# Patient Record
Sex: Female | Born: 1980 | Race: White | Hispanic: No | Marital: Married | Smoking: Former smoker
Health system: Southern US, Community
[De-identification: ages and names within clinical notes are randomized; demographics above are authoritative.]

## PROBLEM LIST (undated history)

## (undated) ENCOUNTER — Inpatient Hospital Stay (HOSPITAL_COMMUNITY): Payer: Self-pay

## (undated) DIAGNOSIS — M1990 Unspecified osteoarthritis, unspecified site: Secondary | ICD-10-CM

## (undated) DIAGNOSIS — IMO0002 Reserved for concepts with insufficient information to code with codable children: Secondary | ICD-10-CM

## (undated) DIAGNOSIS — O99119 Other diseases of the blood and blood-forming organs and certain disorders involving the immune mechanism complicating pregnancy, unspecified trimester: Secondary | ICD-10-CM

## (undated) DIAGNOSIS — R87619 Unspecified abnormal cytological findings in specimens from cervix uteri: Secondary | ICD-10-CM

## (undated) DIAGNOSIS — R51 Headache: Secondary | ICD-10-CM

## (undated) DIAGNOSIS — D6862 Lupus anticoagulant syndrome: Secondary | ICD-10-CM

## (undated) DIAGNOSIS — O99350 Diseases of the nervous system complicating pregnancy, unspecified trimester: Secondary | ICD-10-CM

## (undated) DIAGNOSIS — B009 Herpesviral infection, unspecified: Secondary | ICD-10-CM

## (undated) DIAGNOSIS — D696 Thrombocytopenia, unspecified: Secondary | ICD-10-CM

## (undated) DIAGNOSIS — T7840XA Allergy, unspecified, initial encounter: Secondary | ICD-10-CM

## (undated) DIAGNOSIS — R519 Headache, unspecified: Secondary | ICD-10-CM

## (undated) DIAGNOSIS — F419 Anxiety disorder, unspecified: Secondary | ICD-10-CM

## (undated) DIAGNOSIS — G5600 Carpal tunnel syndrome, unspecified upper limb: Secondary | ICD-10-CM

## (undated) DIAGNOSIS — L4050 Arthropathic psoriasis, unspecified: Secondary | ICD-10-CM

## (undated) DIAGNOSIS — K5190 Ulcerative colitis, unspecified, without complications: Secondary | ICD-10-CM

## (undated) HISTORY — DX: Lupus anticoagulant syndrome: D68.62

## (undated) HISTORY — DX: Carpal tunnel syndrome, unspecified upper limb: G56.00

## (undated) HISTORY — DX: Arthropathic psoriasis, unspecified: L40.50

## (undated) HISTORY — DX: Herpesviral infection, unspecified: B00.9

## (undated) HISTORY — PX: MOUTH SURGERY: SHX715

## (undated) HISTORY — PX: WISDOM TOOTH EXTRACTION: SHX21

## (undated) HISTORY — DX: Anxiety disorder, unspecified: F41.9

## (undated) HISTORY — DX: Diseases of the nervous system complicating pregnancy, unspecified trimester: O99.350

## (undated) HISTORY — DX: Allergy, unspecified, initial encounter: T78.40XA

## (undated) HISTORY — PX: LEEP: SHX91

## (undated) HISTORY — DX: Ulcerative colitis, unspecified, without complications: K51.90

---

## 2007-03-14 ENCOUNTER — Other Ambulatory Visit: Admission: RE | Admit: 2007-03-14 | Discharge: 2007-03-14 | Payer: Self-pay | Admitting: Obstetrics and Gynecology

## 2008-03-16 ENCOUNTER — Other Ambulatory Visit: Admission: RE | Admit: 2008-03-16 | Discharge: 2008-03-16 | Payer: Self-pay | Admitting: Obstetrics & Gynecology

## 2010-06-09 ENCOUNTER — Ambulatory Visit (INDEPENDENT_AMBULATORY_CARE_PROVIDER_SITE_OTHER): Payer: Self-pay | Admitting: Cardiovascular Disease

## 2010-06-09 DIAGNOSIS — R079 Chest pain, unspecified: Secondary | ICD-10-CM

## 2011-04-11 LAB — OB RESULTS CONSOLE ABO/RH

## 2011-04-11 LAB — OB RESULTS CONSOLE RUBELLA ANTIBODY, IGM: Rubella: IMMUNE

## 2011-04-11 LAB — OB RESULTS CONSOLE ANTIBODY SCREEN: Antibody Screen: NEGATIVE

## 2011-04-11 LAB — OB RESULTS CONSOLE HEPATITIS B SURFACE ANTIGEN: Hepatitis B Surface Ag: NEGATIVE

## 2011-04-11 LAB — OB RESULTS CONSOLE GC/CHLAMYDIA: Gonorrhea: NEGATIVE

## 2011-04-25 DIAGNOSIS — G5600 Carpal tunnel syndrome, unspecified upper limb: Secondary | ICD-10-CM

## 2011-04-25 HISTORY — DX: Carpal tunnel syndrome, unspecified upper limb: G56.00

## 2011-07-26 ENCOUNTER — Ambulatory Visit: Payer: Self-pay | Admitting: Cardiovascular Disease

## 2011-09-05 ENCOUNTER — Encounter: Payer: Self-pay | Admitting: *Deleted

## 2011-09-08 ENCOUNTER — Ambulatory Visit: Payer: Self-pay | Admitting: Cardiovascular Disease

## 2011-10-27 ENCOUNTER — Inpatient Hospital Stay (HOSPITAL_COMMUNITY)
Admission: AD | Admit: 2011-10-27 | Discharge: 2011-10-27 | Disposition: A | Discharge status: Home / Self Care | Payer: BC Managed Care – PPO | Source: Ambulatory Visit | Attending: Obstetrics and Gynecology | Admitting: Obstetrics and Gynecology

## 2011-10-27 ENCOUNTER — Encounter (HOSPITAL_COMMUNITY): Payer: Self-pay

## 2011-10-27 DIAGNOSIS — O479 False labor, unspecified: Secondary | ICD-10-CM

## 2011-10-27 DIAGNOSIS — O4690 Antepartum hemorrhage, unspecified, unspecified trimester: Secondary | ICD-10-CM | POA: Insufficient documentation

## 2011-10-27 HISTORY — DX: Unspecified abnormal cytological findings in specimens from cervix uteri: R87.619

## 2011-10-27 HISTORY — DX: Reserved for concepts with insufficient information to code with codable children: IMO0002

## 2011-10-27 LAB — POCT FERN TEST

## 2011-10-27 NOTE — MAU Note (Signed)
Ariel Pierce CNM in to do speculum exam- no fluid seen- fern negative. Vag exam 1/70/-3 vertex

## 2011-10-27 NOTE — MAU Provider Note (Signed)
  History     CSN: [id number]  Arrival date and time: 10/27/11 1540   First Provider Initiated Contact with Patient 10/27/11 1711      Chief Complaint  Patient presents with  . Vaginal Bleeding  . Rupture of Membranes   HPI This is a .31 y.o. female at [redacted]w[redacted]d who presents with c/o  vaginal bleeding and possible leaking after having a cervical exam this morning. Blood she saw was brown. + FM.   OB History    Grav Para Term Preterm Abortions TAB SAB Ect Mult Living   1               Past Medical History  Diagnosis Date  . Chest pain   . Lupus anticoagulant disorder   . Abnormal Pap smear     Past Surgical History  Procedure Date  . No past surgeries     No family history on file.  History  Substance Use Topics  . Smoking status: Never Smoker   . Smokeless tobacco: Not on file  . Alcohol Use: No     Rarely    Allergies: No Known Allergies  Prescriptions prior to admission  Medication Sig Dispense Refill  . acetaminophen-codeine (TYLENOL #3) 300-30 MG per tablet Take 1 tablet by mouth every 4 (four) hours as needed. pain      . fexofenadine (ALLEGRA) 180 MG tablet Take 180 mg by mouth daily.      Marland Kitchen loratadine-pseudoephedrine (CLARITIN-D 12-HOUR) 5-120 MG per tablet Take 1 tablet by mouth daily as needed. allergies      . Prenatal Vit-Fe Fumarate-FA (PRENATAL MULTIVITAMIN) TABS Take 1 tablet by mouth at bedtime.        ROS As in HPI  Physical Exam   Blood pressure 125/75, pulse 94, temperature 97.9 F (36.6 C), temperature source Oral, resp. rate 20, height 5\' 5"  (1.651 m), weight 190 lb 9.6 oz (86.456 kg), SpO2 100.00%.  Physical Exam  Constitutional: She is oriented to person, place, and time. She appears well-developed and well-nourished. No distress.  Cardiovascular: Normal rate.   Respiratory: Effort normal.  GI: Soft.  Genitourinary: Uterus normal. Vaginal discharge (dk brown show, ) found.       Dilation: 1 Effacement (%): 70 Cervical Position:  Posterior Station: -3 Presentation: Vertex Exam by:: Artelia Laroche CNM   Musculoskeletal: Normal range of motion.  Neurological: She is alert and oriented to person, place, and time.  Skin: Skin is warm and dry.  Psychiatric: She has a normal mood and affect.   Spec Exam:  No pooling, No ferning  MAU Course  Procedures  Assessment and Plan  A:  SIUP at [redacted]w[redacted]d       Intact membranes      Bloody mucous s/p cervical exam P:  Discharge home       Labor precautions       Followup at office as needed/scheduled  [facility] 10/27/2011, 5:53 PM

## 2011-10-27 NOTE — MAU Note (Signed)
Patient states she was seen in the office today and was 2 cm. Had a period like episode of brown bleeding with fluid. Has been having some cramping. Reports good fetal movement.

## 2011-11-14 ENCOUNTER — Telehealth (HOSPITAL_COMMUNITY): Payer: Self-pay | Admitting: *Deleted

## 2011-11-14 ENCOUNTER — Encounter (HOSPITAL_COMMUNITY): Payer: Self-pay | Admitting: *Deleted

## 2011-11-14 NOTE — Telephone Encounter (Signed)
Preadmission screen  

## 2011-11-15 ENCOUNTER — Inpatient Hospital Stay (HOSPITAL_COMMUNITY)
Admission: AD | Admit: 2011-11-15 | Discharge: 2011-11-18 | DRG: 371 | Disposition: A | Discharge status: Home / Self Care | Payer: BC Managed Care – PPO | Source: Ambulatory Visit | Attending: Obstetrics and Gynecology | Admitting: Obstetrics and Gynecology

## 2011-11-15 ENCOUNTER — Encounter (HOSPITAL_COMMUNITY): Payer: Self-pay | Admitting: *Deleted

## 2011-11-15 DIAGNOSIS — Z22330 Carrier of Group B streptococcus: Secondary | ICD-10-CM

## 2011-11-15 DIAGNOSIS — O99892 Other specified diseases and conditions complicating childbirth: Secondary | ICD-10-CM | POA: Diagnosis present

## 2011-11-15 DIAGNOSIS — O411090 Infection of amniotic sac and membranes, unspecified, unspecified trimester, not applicable or unspecified: Secondary | ICD-10-CM | POA: Diagnosis present

## 2011-11-15 LAB — COMPREHENSIVE METABOLIC PANEL
ALT: 13 U/L (ref 0–35)
AST: 23 U/L (ref 0–37)
Albumin: 2.6 g/dL — ABNORMAL LOW (ref 3.5–5.2)
Calcium: 8.5 mg/dL (ref 8.4–10.5)
Creatinine, Ser: 0.68 mg/dL (ref 0.50–1.10)
Sodium: 134 mEq/L — ABNORMAL LOW (ref 135–145)
Total Protein: 5.6 g/dL — ABNORMAL LOW (ref 6.0–8.3)

## 2011-11-15 LAB — URINALYSIS, ROUTINE W REFLEX MICROSCOPIC
Bilirubin Urine: NEGATIVE
Ketones, ur: 15 mg/dL — AB
Leukocytes, UA: NEGATIVE
Nitrite: NEGATIVE
Urobilinogen, UA: 0.2 mg/dL (ref 0.0–1.0)

## 2011-11-15 LAB — TYPE AND SCREEN
ABO/RH(D): O POS
Antibody Screen: NEGATIVE

## 2011-11-15 LAB — CBC
Hemoglobin: 13.2 g/dL (ref 12.0–15.0)
MCHC: 34.9 g/dL (ref 30.0–36.0)
Platelets: 149 10*3/uL — ABNORMAL LOW (ref 150–400)
RDW: 13.6 % (ref 11.5–15.5)

## 2011-11-15 MED ORDER — OXYCODONE-ACETAMINOPHEN 5-325 MG PO TABS: 1.0000 | ORAL_TABLET | ORAL | Status: DC | PRN

## 2011-11-15 MED ORDER — LACTATED RINGERS IV SOLN: 500.0000 mL | INTRAVENOUS | Status: DC | PRN

## 2011-11-15 MED ORDER — LIDOCAINE HCL (PF) 1 % IJ SOLN: 30.0000 mL | INTRAMUSCULAR | Status: DC | PRN

## 2011-11-15 MED ORDER — IBUPROFEN 600 MG PO TABS: 600.0000 mg | ORAL_TABLET | Freq: Four times a day (QID) | ORAL | Status: DC | PRN

## 2011-11-15 MED ORDER — POTASSIUM CHLORIDE CRYS ER 20 MEQ PO TBCR
20.0000 meq | EXTENDED_RELEASE_TABLET | Freq: Two times a day (BID) | ORAL | Status: DC
  Administered 2011-11-15 – 2011-11-16 (×2): 20 meq via ORAL
  Filled 2011-11-15 (×5): qty 1

## 2011-11-15 MED ORDER — GENTAMICIN SULFATE 40 MG/ML IJ SOLN
180.0000 mg | Freq: Three times a day (TID) | INTRAVENOUS | Status: DC
  Administered 2011-11-15 – 2011-11-16 (×3): 180 mg via INTRAVENOUS
  Filled 2011-11-15 (×4): qty 4.5

## 2011-11-15 MED ORDER — SODIUM CHLORIDE 0.9 % IV SOLN
1.0000 g | INTRAVENOUS | Status: DC
  Administered 2011-11-16 (×3): 1 g via INTRAVENOUS
  Filled 2011-11-15 (×7): qty 1000

## 2011-11-15 MED ORDER — ONDANSETRON HCL 4 MG/2ML IJ SOLN: 4.0000 mg | Freq: Four times a day (QID) | INTRAMUSCULAR | Status: DC | PRN

## 2011-11-15 MED ORDER — LACTATED RINGERS IV SOLN
INTRAVENOUS | Status: DC
  Administered 2011-11-15 – 2011-11-16 (×4): via INTRAVENOUS

## 2011-11-15 MED ORDER — AMPICILLIN SODIUM 2 G IJ SOLR
2.0000 g | Freq: Once | INTRAMUSCULAR | Status: AC
  Administered 2011-11-15: 2 g via INTRAVENOUS
  Filled 2011-11-15: qty 2000

## 2011-11-15 MED ORDER — POTASSIUM CHLORIDE 20 MEQ PO PACK: 20.0000 meq | PACK | Freq: Two times a day (BID) | ORAL | Status: DC

## 2011-11-15 MED ORDER — PENICILLIN G POTASSIUM 5000000 UNITS IJ SOLR: 2.5000 10*6.[IU] | INTRAVENOUS | Status: DC

## 2011-11-15 MED ORDER — CITRIC ACID-SODIUM CITRATE 334-500 MG/5ML PO SOLN
30.0000 mL | ORAL | Status: DC | PRN
  Administered 2011-11-16: 30 mL via ORAL
  Filled 2011-11-15: qty 15

## 2011-11-15 MED ORDER — FLEET ENEMA 7-19 GM/118ML RE ENEM: 1.0000 | ENEMA | RECTAL | Status: DC | PRN

## 2011-11-15 MED ORDER — OXYTOCIN BOLUS FROM INFUSION
250.0000 mL | Freq: Once | INTRAVENOUS | Status: DC
  Filled 2011-11-15: qty 500

## 2011-11-15 MED ORDER — PENICILLIN G POTASSIUM 5000000 UNITS IJ SOLR: 5.0000 10*6.[IU] | Freq: Once | INTRAVENOUS | Status: DC

## 2011-11-15 MED ORDER — ACETAMINOPHEN 325 MG PO TABS: 650.0000 mg | ORAL_TABLET | ORAL | Status: DC | PRN

## 2011-11-15 MED ORDER — ACETAMINOPHEN 500 MG PO TABS
1000.0000 mg | ORAL_TABLET | Freq: Four times a day (QID) | ORAL | Status: DC | PRN
  Administered 2011-11-15 – 2011-11-16 (×3): 1000 mg via ORAL
  Filled 2011-11-15 (×3): qty 2

## 2011-11-15 MED ORDER — OXYTOCIN 40 UNITS IN LACTATED RINGERS INFUSION - SIMPLE MED: 62.5000 mL/h | Freq: Once | INTRAVENOUS | Status: DC

## 2011-11-15 NOTE — MAU Provider Note (Signed)
Ariel Pierce is a 31 y.o. female @ [redacted]w[redacted]d gestation who presents to MAU for fever, visual changes, generalized weakness and flu like symptoms. She began yesterday with abdominal pain and stiffness in her arms and legs. She called the office and they told her it was probably nerve pain in her lower abdomen and legs.  The symptoms lasted about 3 hours. Today the symptoms returned with fever. She reports a cramping pain in her lower abdomen that she rates 8/10 that is constant with occasional sharp pain. She also reports seeing " shooting stars before her eyes".  Her legs feel tight and hurt.  Exam: BP 152/86  Pulse 128  Temp 100.4 F (38 C) (Oral)  Resp 20  Ht 5\' 6"  (1.676 m)  Wt 191 lb (86.637 kg)  BMI 30.83 kg/m2  SpO2 100%  Patient is alert and oriented and in no acute distress. Abdomen gravid consistent with dates. Non tender with palpation. Unable to reproduce the cramping pain in the lower abdomen.  Lower extremities with edema.  Cervical check by RN Alvino Chapel) and is unchanged from the last office visit.  EFM Baseline 175, contracting every 3 minutes, reactive tracing.   Results for orders placed during the hospital encounter of 11/15/11 (from the past 24 hour(s))  URINALYSIS, ROUTINE W REFLEX MICROSCOPIC     Status: Abnormal   Collection Time   11/15/11  8:35 PM      Component Value Range   Color, Urine YELLOW  YELLOW   APPearance CLEAR  CLEAR   Specific Gravity, Urine 1.010  1.005 - 1.030   pH 7.5  5.0 - 8.0   Glucose, UA NEGATIVE  NEGATIVE mg/dL   Hgb urine dipstick NEGATIVE  NEGATIVE   Bilirubin Urine NEGATIVE  NEGATIVE   Ketones, ur 15 (*) NEGATIVE mg/dL   Protein, ur NEGATIVE  NEGATIVE mg/dL   Urobilinogen, UA 0.2  0.0 - 1.0 mg/dL   Nitrite NEGATIVE  NEGATIVE   Leukocytes, UA NEGATIVE  NEGATIVE   Consult with Dr. Renaldo Fiddler. She will put in admission orders.   Medical screening exam complete and patient is stable to continue care by Dr. Renaldo Fiddler.

## 2011-11-15 NOTE — H&P (Signed)
31 yo G1 @ 39+5 presents w/ 1 day h/o fever & bilateral lower abdominal pain.  Pt reports BLQ pain began yesterday a/w back pain.  Fever and generalized ill-feeling began today.  No dysuria, hematuria.  Mild nausea, no emesis.  No vb or lof.  PNC:  GBS +  PMHx:  + h/o lupus anticoagulant.  Negative w/u for APLS, no h/o VTE PSHx:  Neg All:  Zyrtec, latex Meds:  PNV, oxycodone prn back pain  Temp 100.4, BP 130-150s/80-90s,  HR 120s   FHT 170-180, reassuring w/ accels Toco Q3-5 Gen - NAD CV - tachy, regular rhythm Lungs - clear bilaterally Abd - gravid w/ moderate bilateral LQ tenderness.  No R/G.  No CVAT Ext - 2-3+ edema bilaterally Cvx - 2cm,   A/P:  Suspect Chorioamnionitis Start ampicillin & gentamycin Tylenol Plan to augment w/ pitocin

## 2011-11-15 NOTE — Progress Notes (Signed)
Dr Renaldo Fiddler notified of Rm #[id number] and that pt has constant, lower abd pain that is not due to ctxs. When NP palpated lower abd, could not reproduce the pain.

## 2011-11-15 NOTE — Progress Notes (Signed)
ERin RN in [facility] given report.

## 2011-11-15 NOTE — MAU Note (Signed)
Pt G1 at 39.5wks having abd pain since Sunday was seen in the office yesterday, dx with nerve pain-given oxycodone which is not helping.  Pain continues and is becoming stronger.  Pt reports extremity weakness and stiffness since 1400 today and a fever of 99.1 about ago.

## 2011-11-15 NOTE — Progress Notes (Signed)
To Bs via w/c 

## 2011-11-15 NOTE — Progress Notes (Signed)
ANTIBIOTIC CONSULT NOTE - INITIAL  Pharmacy Consult for Gentamicin Indication: R/O Chorioamnionitis  Allergies  Allergen Reactions  . Zyrtec (Cetirizine Hcl) Swelling  . Latex Rash    Patient Measurements: Height: 5\' 6"  (167.6 cm) Weight: 191 lb (86.637 kg) IBW/kg (Calculated) : 59.3  Adjusted Body Weight: 67.5kg  Vital Signs: Temp: 100.4 F (38 C) (07/24 2045) Temp src: Oral (07/24 2045) BP: 152/87 mmHg (07/24 2116) Pulse Rate: 133  (07/24 2116)  Labs:  Basename 11/15/11 2135  WBC 23.1*  HGB 13.2  PLT 149*  LABCREA --  CREATININE --   Estimated SCr=0.7 with estimated CrCl > 140ml/min  Medical History: Past Medical History  Diagnosis Date  . Chest pain   . Lupus anticoagulant disorder   . Abnormal Pap smear   . Pregnancy related carpal tunnel syndrome, antepartum   . H/O varicella   . Anxiety     panic attacks    Medications:  Ampicillin 2 gram IV then 1 gram IV q6h Assessment: [name] 39+ weeks admitted for IOL. Antibiotics initiated for Maternal temp(100.4) and bilateral abd pain x 1 day.  Goal of Therapy:  Gentamicin peak 6-32mcg/ml and trough < 69mcg/ml  Plan:  1. Gentamicin 180mg  IV q8h. 2. Will draw SCr if continued postpartum. 3. Will continue to follow and draw levels as clinically indicated. Thanks!  Claybon Jabs 11/15/2011,9:55 PM

## 2011-11-16 ENCOUNTER — Encounter (HOSPITAL_COMMUNITY): Payer: Self-pay | Admitting: Anesthesiology

## 2011-11-16 ENCOUNTER — Encounter (HOSPITAL_COMMUNITY)
Admission: AD | Disposition: A | Discharge status: Home / Self Care | Payer: Self-pay | Source: Ambulatory Visit | Attending: Obstetrics and Gynecology

## 2011-11-16 ENCOUNTER — Encounter (HOSPITAL_COMMUNITY): Payer: Self-pay | Admitting: *Deleted

## 2011-11-16 ENCOUNTER — Inpatient Hospital Stay (HOSPITAL_COMMUNITY): Payer: BC Managed Care – PPO | Admitting: Anesthesiology

## 2011-11-16 LAB — CBC
Hemoglobin: 12.3 g/dL (ref 12.0–15.0)
MCHC: 33.5 g/dL (ref 30.0–36.0)
RDW: 13.7 % (ref 11.5–15.5)
WBC: 21.2 10*3/uL — ABNORMAL HIGH (ref 4.0–10.5)

## 2011-11-16 LAB — RPR: RPR Ser Ql: NONREACTIVE

## 2011-11-16 SURGERY — Surgical Case
Anesthesia: Regional

## 2011-11-16 MED ORDER — ACETAMINOPHEN 10 MG/ML IV SOLN: 1000.0000 mg | Freq: Four times a day (QID) | INTRAVENOUS | Status: DC | PRN

## 2011-11-16 MED ORDER — DIPHENHYDRAMINE HCL 25 MG PO CAPS
25.0000 mg | ORAL_CAPSULE | Freq: Four times a day (QID) | ORAL | Status: DC | PRN
  Administered 2011-11-17: 25 mg via ORAL
  Filled 2011-11-16: qty 1

## 2011-11-16 MED ORDER — FENTANYL 2.5 MCG/ML BUPIVACAINE 1/10 % EPIDURAL INFUSION (WH - ANES)
INTRAMUSCULAR | Status: DC | PRN
  Administered 2011-11-16: 14 mL/h via EPIDURAL

## 2011-11-16 MED ORDER — FENTANYL 2.5 MCG/ML BUPIVACAINE 1/10 % EPIDURAL INFUSION (WH - ANES)
14.0000 mL/h | INTRAMUSCULAR | Status: DC
  Administered 2011-11-16 (×2): 14 mL/h via EPIDURAL
  Filled 2011-11-16 (×3): qty 60

## 2011-11-16 MED ORDER — PROMETHAZINE HCL 25 MG/ML IJ SOLN: 6.2500 mg | INTRAMUSCULAR | Status: DC | PRN

## 2011-11-16 MED ORDER — METOCLOPRAMIDE HCL 5 MG/ML IJ SOLN: 10.0000 mg | Freq: Three times a day (TID) | INTRAMUSCULAR | Status: DC | PRN

## 2011-11-16 MED ORDER — PHENYLEPHRINE 40 MCG/ML (10ML) SYRINGE FOR IV PUSH (FOR BLOOD PRESSURE SUPPORT)
PREFILLED_SYRINGE | INTRAVENOUS | Status: AC
  Filled 2011-11-16: qty 5

## 2011-11-16 MED ORDER — GENTAMICIN SULFATE 40 MG/ML IJ SOLN
180.0000 mg | Freq: Three times a day (TID) | INTRAVENOUS | Status: AC
  Administered 2011-11-16 – 2011-11-17 (×3): 180 mg via INTRAVENOUS
  Filled 2011-11-16 (×3): qty 4.5

## 2011-11-16 MED ORDER — OXYTOCIN 40 UNITS IN LACTATED RINGERS INFUSION - SIMPLE MED
1.0000 m[IU]/min | INTRAVENOUS | Status: DC
  Administered 2011-11-16: 2 m[IU]/min via INTRAVENOUS

## 2011-11-16 MED ORDER — DIPHENHYDRAMINE HCL 50 MG/ML IJ SOLN: 12.5000 mg | INTRAMUSCULAR | Status: DC | PRN

## 2011-11-16 MED ORDER — DIPHENHYDRAMINE HCL 50 MG/ML IJ SOLN: 25.0000 mg | INTRAMUSCULAR | Status: DC | PRN

## 2011-11-16 MED ORDER — EPHEDRINE 5 MG/ML INJ: 10.0000 mg | INTRAVENOUS | Status: DC | PRN

## 2011-11-16 MED ORDER — LANOLIN HYDROUS EX OINT: 1.0000 "application " | TOPICAL_OINTMENT | CUTANEOUS | Status: DC | PRN

## 2011-11-16 MED ORDER — MEPERIDINE HCL 25 MG/ML IJ SOLN
INTRAMUSCULAR | Status: AC
  Administered 2011-11-16: 12.5 mg via INTRAVENOUS
  Filled 2011-11-16: qty 1

## 2011-11-16 MED ORDER — LACTATED RINGERS IV SOLN
INTRAVENOUS | Status: DC
  Administered 2011-11-16: 19:00:00 via INTRAVENOUS

## 2011-11-16 MED ORDER — TERBUTALINE SULFATE 1 MG/ML IJ SOLN: 0.2500 mg | Freq: Once | INTRAMUSCULAR | Status: DC | PRN

## 2011-11-16 MED ORDER — LACTATED RINGERS IV SOLN
INTRAVENOUS | Status: DC
  Administered 2011-11-17: 01:00:00 via INTRAVENOUS

## 2011-11-16 MED ORDER — ONDANSETRON HCL 4 MG PO TABS: 4.0000 mg | ORAL_TABLET | ORAL | Status: DC | PRN

## 2011-11-16 MED ORDER — KETOROLAC TROMETHAMINE 30 MG/ML IJ SOLN
INTRAMUSCULAR | Status: AC
  Administered 2011-11-16: 30 mg via INTRAMUSCULAR
  Filled 2011-11-16: qty 1

## 2011-11-16 MED ORDER — SODIUM BICARBONATE 8.4 % IV SOLN
INTRAVENOUS | Status: DC | PRN
  Administered 2011-11-16: 10 mL via EPIDURAL

## 2011-11-16 MED ORDER — SCOPOLAMINE 1 MG/3DAYS TD PT72
MEDICATED_PATCH | TRANSDERMAL | Status: AC
  Filled 2011-11-16: qty 1

## 2011-11-16 MED ORDER — LORATADINE 10 MG PO TABS
10.0000 mg | ORAL_TABLET | Freq: Every day | ORAL | Status: DC
  Administered 2011-11-16: 10 mg via ORAL
  Filled 2011-11-16 (×2): qty 1

## 2011-11-16 MED ORDER — FENTANYL CITRATE 0.05 MG/ML IJ SOLN
INTRAMUSCULAR | Status: AC
  Administered 2011-11-16: 50 ug via INTRAVENOUS
  Filled 2011-11-16: qty 2

## 2011-11-16 MED ORDER — DIPHENHYDRAMINE HCL 25 MG PO CAPS
25.0000 mg | ORAL_CAPSULE | ORAL | Status: DC | PRN
  Filled 2011-11-16: qty 1

## 2011-11-16 MED ORDER — MORPHINE SULFATE (PF) 0.5 MG/ML IJ SOLN
INTRAMUSCULAR | Status: DC | PRN
  Administered 2011-11-16: 1 mg via INTRAVENOUS

## 2011-11-16 MED ORDER — LACTATED RINGERS IV SOLN: 500.0000 mL | Freq: Once | INTRAVENOUS | Status: DC

## 2011-11-16 MED ORDER — SODIUM BICARBONATE 8.4 % IV SOLN
INTRAVENOUS | Status: AC
  Filled 2011-11-16: qty 50

## 2011-11-16 MED ORDER — OXYTOCIN 40 UNITS IN LACTATED RINGERS INFUSION - SIMPLE MED
1.0000 m[IU]/min | INTRAVENOUS | Status: DC
  Administered 2011-11-16: 8 m[IU]/min via INTRAVENOUS
  Filled 2011-11-16: qty 1000

## 2011-11-16 MED ORDER — MORPHINE SULFATE (PF) 0.5 MG/ML IJ SOLN
INTRAMUSCULAR | Status: DC | PRN
  Administered 2011-11-16: 4 mg via EPIDURAL

## 2011-11-16 MED ORDER — SODIUM CHLORIDE 0.9 % IJ SOLN: 3.0000 mL | INTRAMUSCULAR | Status: DC | PRN

## 2011-11-16 MED ORDER — SCOPOLAMINE 1 MG/3DAYS TD PT72
1.0000 | MEDICATED_PATCH | Freq: Once | TRANSDERMAL | Status: DC
  Administered 2011-11-16: 1.5 mg via TRANSDERMAL

## 2011-11-16 MED ORDER — MIDAZOLAM HCL 2 MG/2ML IJ SOLN: 0.5000 mg | Freq: Once | INTRAMUSCULAR | Status: DC | PRN

## 2011-11-16 MED ORDER — LIDOCAINE HCL (PF) 1 % IJ SOLN
INTRAMUSCULAR | Status: DC | PRN
  Administered 2011-11-16 (×2): 5 mL

## 2011-11-16 MED ORDER — MENTHOL 3 MG MT LOZG: 1.0000 | LOZENGE | OROMUCOSAL | Status: DC | PRN

## 2011-11-16 MED ORDER — NALBUPHINE HCL 10 MG/ML IJ SOLN
5.0000 mg | INTRAMUSCULAR | Status: DC | PRN
Start: 2011-11-16 — End: 2011-11-16

## 2011-11-16 MED ORDER — ONDANSETRON HCL 4 MG/2ML IJ SOLN: 4.0000 mg | INTRAMUSCULAR | Status: DC | PRN

## 2011-11-16 MED ORDER — OXYTOCIN 10 UNIT/ML IJ SOLN
40.0000 [IU] | INTRAVENOUS | Status: DC | PRN
  Administered 2011-11-16: 40 [IU] via INTRAVENOUS

## 2011-11-16 MED ORDER — BUTORPHANOL TARTRATE 1 MG/ML IJ SOLN
1.0000 mg | INTRAMUSCULAR | Status: DC | PRN
  Administered 2011-11-16: 1 mg via INTRAVENOUS
  Filled 2011-11-16: qty 1

## 2011-11-16 MED ORDER — PHENYLEPHRINE HCL 10 MG/ML IJ SOLN
INTRAMUSCULAR | Status: DC | PRN
  Administered 2011-11-16: 80 ug via INTRAVENOUS
  Administered 2011-11-16: 40 ug via INTRAVENOUS
  Administered 2011-11-16 (×4): 80 ug via INTRAVENOUS
  Administered 2011-11-16: 40 ug via INTRAVENOUS
  Administered 2011-11-16: 80 ug via INTRAVENOUS

## 2011-11-16 MED ORDER — EPHEDRINE 5 MG/ML INJ
10.0000 mg | INTRAVENOUS | Status: DC | PRN
  Filled 2011-11-16: qty 4

## 2011-11-16 MED ORDER — BUPIVACAINE HCL (PF) 0.25 % IJ SOLN
INTRAMUSCULAR | Status: DC | PRN
  Administered 2011-11-16: 30 mL

## 2011-11-16 MED ORDER — ONDANSETRON HCL 4 MG/2ML IJ SOLN
INTRAMUSCULAR | Status: AC
  Filled 2011-11-16: qty 2

## 2011-11-16 MED ORDER — WITCH HAZEL-GLYCERIN EX PADS: 1.0000 "application " | MEDICATED_PAD | CUTANEOUS | Status: DC | PRN

## 2011-11-16 MED ORDER — PHENYLEPHRINE 40 MCG/ML (10ML) SYRINGE FOR IV PUSH (FOR BLOOD PRESSURE SUPPORT)
80.0000 ug | PREFILLED_SYRINGE | INTRAVENOUS | Status: DC | PRN
  Filled 2011-11-16: qty 5

## 2011-11-16 MED ORDER — SODIUM CHLORIDE 0.9 % IV SOLN: 1.0000 ug/kg/h | INTRAVENOUS | Status: DC | PRN

## 2011-11-16 MED ORDER — OXYTOCIN 40 UNITS IN LACTATED RINGERS INFUSION - SIMPLE MED: 62.5000 mL/h | INTRAVENOUS | Status: AC

## 2011-11-16 MED ORDER — ONDANSETRON HCL 4 MG/2ML IJ SOLN: 4.0000 mg | Freq: Three times a day (TID) | INTRAMUSCULAR | Status: DC | PRN

## 2011-11-16 MED ORDER — LIDOCAINE-EPINEPHRINE (PF) 2 %-1:200000 IJ SOLN
INTRAMUSCULAR | Status: AC
  Filled 2011-11-16: qty 20

## 2011-11-16 MED ORDER — MEPERIDINE HCL 25 MG/ML IJ SOLN
6.2500 mg | INTRAMUSCULAR | Status: DC | PRN
  Administered 2011-11-16 (×2): 12.5 mg via INTRAVENOUS

## 2011-11-16 MED ORDER — TETANUS-DIPHTH-ACELL PERTUSSIS 5-2.5-18.5 LF-MCG/0.5 IM SUSP: 0.5000 mL | Freq: Once | INTRAMUSCULAR | Status: DC

## 2011-11-16 MED ORDER — FENTANYL CITRATE 0.05 MG/ML IJ SOLN
25.0000 ug | INTRAMUSCULAR | Status: DC | PRN
  Administered 2011-11-16 (×3): 50 ug via INTRAVENOUS

## 2011-11-16 MED ORDER — ONDANSETRON HCL 4 MG/2ML IJ SOLN
INTRAMUSCULAR | Status: DC | PRN
  Administered 2011-11-16: 4 mg via INTRAVENOUS

## 2011-11-16 MED ORDER — SIMETHICONE 80 MG PO CHEW
80.0000 mg | CHEWABLE_TABLET | Freq: Three times a day (TID) | ORAL | Status: DC
  Administered 2011-11-16 – 2011-11-18 (×6): 80 mg via ORAL

## 2011-11-16 MED ORDER — PRENATAL MULTIVITAMIN CH
1.0000 | ORAL_TABLET | Freq: Every day | ORAL | Status: DC
  Filled 2011-11-16 (×2): qty 1

## 2011-11-16 MED ORDER — SIMETHICONE 80 MG PO CHEW: 80.0000 mg | CHEWABLE_TABLET | ORAL | Status: DC | PRN

## 2011-11-16 MED ORDER — MEPERIDINE HCL 25 MG/ML IJ SOLN: 6.2500 mg | INTRAMUSCULAR | Status: DC | PRN

## 2011-11-16 MED ORDER — IBUPROFEN 600 MG PO TABS
600.0000 mg | ORAL_TABLET | Freq: Four times a day (QID) | ORAL | Status: DC
  Administered 2011-11-16 – 2011-11-18 (×7): 600 mg via ORAL
  Filled 2011-11-16 (×7): qty 1

## 2011-11-16 MED ORDER — KETOROLAC TROMETHAMINE 30 MG/ML IJ SOLN: 30.0000 mg | Freq: Four times a day (QID) | INTRAMUSCULAR | Status: DC | PRN

## 2011-11-16 MED ORDER — NALBUPHINE HCL 10 MG/ML IJ SOLN: 5.0000 mg | INTRAMUSCULAR | Status: DC | PRN

## 2011-11-16 MED ORDER — SODIUM CHLORIDE 0.9 % IV SOLN
1.5000 g | Freq: Four times a day (QID) | INTRAVENOUS | Status: AC
  Administered 2011-11-16 – 2011-11-17 (×3): 1.5 g via INTRAVENOUS
  Filled 2011-11-16 (×3): qty 1.5

## 2011-11-16 MED ORDER — ZOLPIDEM TARTRATE 5 MG PO TABS: 5.0000 mg | ORAL_TABLET | Freq: Every evening | ORAL | Status: DC | PRN

## 2011-11-16 MED ORDER — NALOXONE HCL 0.4 MG/ML IJ SOLN: 0.4000 mg | INTRAMUSCULAR | Status: DC | PRN

## 2011-11-16 MED ORDER — OXYCODONE-ACETAMINOPHEN 5-325 MG PO TABS
1.0000 | ORAL_TABLET | ORAL | Status: DC | PRN
  Administered 2011-11-17 (×2): 1 via ORAL
  Administered 2011-11-18 (×2): 2 via ORAL
  Filled 2011-11-16: qty 1
  Filled 2011-11-16 (×2): qty 2
  Filled 2011-11-16: qty 1

## 2011-11-16 MED ORDER — DIBUCAINE 1 % RE OINT: 1.0000 "application " | TOPICAL_OINTMENT | RECTAL | Status: DC | PRN

## 2011-11-16 MED ORDER — PHENYLEPHRINE 40 MCG/ML (10ML) SYRINGE FOR IV PUSH (FOR BLOOD PRESSURE SUPPORT): 80.0000 ug | PREFILLED_SYRINGE | INTRAVENOUS | Status: DC | PRN

## 2011-11-16 MED ORDER — PROMETHAZINE HCL 25 MG/ML IJ SOLN
12.5000 mg | INTRAMUSCULAR | Status: DC | PRN
  Administered 2011-11-16: 12.5 mg via INTRAVENOUS
  Filled 2011-11-16: qty 1

## 2011-11-16 MED ORDER — MORPHINE SULFATE 0.5 MG/ML IJ SOLN
INTRAMUSCULAR | Status: AC
  Filled 2011-11-16: qty 10

## 2011-11-16 MED ORDER — LACTATED RINGERS IV SOLN
INTRAVENOUS | Status: DC | PRN
  Administered 2011-11-16 (×2): via INTRAVENOUS

## 2011-11-16 MED ORDER — KETOROLAC TROMETHAMINE 30 MG/ML IJ SOLN
30.0000 mg | Freq: Four times a day (QID) | INTRAMUSCULAR | Status: DC | PRN
  Administered 2011-11-16: 30 mg via INTRAMUSCULAR

## 2011-11-16 MED ORDER — SENNOSIDES-DOCUSATE SODIUM 8.6-50 MG PO TABS
2.0000 | ORAL_TABLET | Freq: Every day | ORAL | Status: DC
  Administered 2011-11-16 – 2011-11-17 (×2): 2 via ORAL

## 2011-11-16 SURGICAL SUPPLY — 26 items
BARRIER ADHS 3X4 INTERCEED (GAUZE/BANDAGES/DRESSINGS) IMPLANT
CHLORAPREP W/TINT 26ML (MISCELLANEOUS) ×2 IMPLANT
CLOTH BEACON ORANGE TIMEOUT ST (SAFETY) ×2 IMPLANT
CONTAINER PREFILL 10% NBF 15ML (MISCELLANEOUS) IMPLANT
ELECT REM PT RETURN 9FT ADLT (ELECTROSURGICAL) ×2
ELECTRODE REM PT RTRN 9FT ADLT (ELECTROSURGICAL) ×1 IMPLANT
EXTRACTOR VACUUM M CUP 4 TUBE (SUCTIONS) IMPLANT
GLOVE BIO SURGEON STRL SZ 6.5 (GLOVE) ×4 IMPLANT
GOWN PREVENTION PLUS LG XLONG (DISPOSABLE) ×6 IMPLANT
KIT ABG SYR 3ML LUER SLIP (SYRINGE) IMPLANT
NEEDLE HYPO 22GX1.5 SAFETY (NEEDLE) ×2 IMPLANT
NEEDLE HYPO 25X5/8 SAFETYGLIDE (NEEDLE) ×2 IMPLANT
NS IRRIG 1000ML POUR BTL (IV SOLUTION) ×2 IMPLANT
PACK C SECTION WH (CUSTOM PROCEDURE TRAY) ×2 IMPLANT
SLEEVE SCD COMPRESS KNEE MED (MISCELLANEOUS) IMPLANT
STAPLER VISISTAT 35W (STAPLE) IMPLANT
SUT CHROMIC 0 CTX 36 (SUTURE) ×4 IMPLANT
SUT PLAIN 0 NONE (SUTURE) IMPLANT
SUT PLAIN 2 0 XLH (SUTURE) IMPLANT
SUT VIC AB 0 CT1 27 (SUTURE) ×3
SUT VIC AB 0 CT1 27XBRD ANBCTR (SUTURE) ×3 IMPLANT
SUT VIC AB 4-0 KS 27 (SUTURE) IMPLANT
SYR CONTROL 10ML LL (SYRINGE) ×2 IMPLANT
TOWEL OR 17X24 6PK STRL BLUE (TOWEL DISPOSABLE) ×4 IMPLANT
TRAY FOLEY CATH 14FR (SET/KITS/TRAYS/PACK) ×2 IMPLANT
WATER STERILE IRR 1000ML POUR (IV SOLUTION) ×2 IMPLANT

## 2011-11-16 NOTE — Progress Notes (Signed)
Patient is feeling better Afebrile now Fetal heart rate is reactive toco irregular ucs Cervix is 90%/3 cm -2 Vertex  arom Think meconium Plan to continue ampicillin and gentamicin Start pitocin epidural

## 2011-11-16 NOTE — Transfer of Care (Signed)
Immediate Anesthesia Transfer of Care Note  Patient: Ariel Pierce  Procedure(s) Performed: Procedure(s) (LRB): CESAREAN SECTION (N/A)  Patient Location: PACU  Anesthesia Type: Regional  Level of Consciousness: awake, alert  and oriented  Airway & Oxygen Therapy: Patient Spontanous Breathing  Post-op Assessment: Report given to PACU RN and Post -op Vital signs reviewed and stable  Post vital signs: Reviewed and stable  Complications: No apparent anesthesia complications

## 2011-11-16 NOTE — Anesthesia Procedure Notes (Signed)
Epidural Patient location during procedure: OB Start time: 11/16/2011 9:23 AM  Staffing Anesthesiologist: Brayton Caves R Performed by: anesthesiologist   Preanesthetic Checklist Completed: patient identified, site marked, surgical consent, pre-op evaluation, timeout performed, IV checked, risks and benefits discussed and monitors and equipment checked  Epidural Patient position: sitting Prep: site prepped and draped and DuraPrep Patient monitoring: continuous pulse ox and blood pressure Approach: midline Injection technique: LOR air and LOR saline  Needle:  Needle type: Tuohy  Needle gauge: 17 G Needle length: 9 cm Needle insertion depth: 6 cm Catheter type: closed end flexible Catheter size: 19 Gauge Catheter at skin depth: 11 cm Test dose: negative  Assessment Events: blood not aspirated, injection not painful, no injection resistance, negative IV test and no paresthesia  Additional Notes Patient identified.  Risk benefits discussed including failed block, incomplete pain control, headache, nerve damage, paralysis, blood pressure changes, nausea, vomiting, reactions to medication both toxic or allergic, and postpartum back pain.  Patient expressed understanding and wished to proceed.  All questions were answered.  Sterile technique used throughout procedure and epidural site dressed with sterile barrier dressing. No paresthesia or other complications noted.The patient did not experience any signs of intravascular injection such as tinnitus or metallic taste in mouth nor signs of intrathecal spread such as rapid motor block. Please see nursing notes for vital signs.

## 2011-11-16 NOTE — Progress Notes (Signed)
Patient is comfortable with epidural Fetal heart rate is reactive Cervix 90% 3 to 4 -2 iupc placed Impression: IUP at term Probable chorioamnionitis PLAN: Follow labor curve closely Continue antibiotics

## 2011-11-16 NOTE — Progress Notes (Signed)
Called Dr. Renaldo Fiddler to provide update and request pain medication order. Wants patient to rest/sleep if possible. Order provided.

## 2011-11-16 NOTE — Anesthesia Preprocedure Evaluation (Signed)
Anesthesia Evaluation  Patient identified by MRN, date of birth, ID band Patient awake    Reviewed: Allergy & Precautions, H&P , Patient's Chart, lab work & pertinent test results  Airway Mallampati: II TM Distance: >3 FB Neck ROM: full    Dental No notable dental hx.    Pulmonary neg pulmonary ROS,  breath sounds clear to auscultation  Pulmonary exam normal       Cardiovascular negative cardio ROS  Rhythm:regular Rate:Normal     Neuro/Psych  Neuromuscular disease negative neurological ROS  negative psych ROS   GI/Hepatic negative GI ROS, Neg liver ROS,   Endo/Other  negative endocrine ROS  Renal/GU negative Renal ROS     Musculoskeletal   Abdominal   Peds  Hematology negative hematology ROS (+)   Anesthesia Other Findings Chest pain     Lupus anticoagulant disorder        Abnormal Pap smear     Pregnancy related carpal tunnel syndrome, antepartum        H/O varicella     Anxiety   panic attacks    Reproductive/Obstetrics (+) Pregnancy                           Anesthesia Physical Anesthesia Plan  ASA: II  Anesthesia Plan: Epidural   Post-op Pain Management:    Induction:   Airway Management Planned:   Additional Equipment:   Intra-op Plan:   Post-operative Plan:   Informed Consent: I have reviewed the patients History and Physical, chart, labs and discussed the procedure including the risks, benefits and alternatives for the proposed anesthesia with the patient or authorized representative who has indicated his/her understanding and acceptance.     Plan Discussed with:   Anesthesia Plan Comments:         Anesthesia Quick Evaluation

## 2011-11-16 NOTE — Brief Op Note (Signed)
11/15/2011 - 11/16/2011  7:02 PM  PATIENT:  Ariel Pierce  31 y.o. female  PRE-OPERATIVE DIAGNOSIS:  Chorioamnionitis, failure to progress  POST-OPERATIVE DIAGNOSIS:  Chorioamnionitis, failure to progress  PROCEDURE:  Procedure(s) (LRB): Primary Low Transverse CESAREAN SECTION (N/A)  SURGEON:  Surgeon(s) and Role:    * Jeani Hawking, MD - Primary  PHYSICIAN ASSISTANT:   ASSISTANTS: none   ANESTHESIA:   epidural  EBL:     BLOOD ADMINISTERED:none  DRAINS: Urinary Catheter (Foley)   LOCAL MEDICATIONS USED:  NONE  SPECIMEN:  No Specimen  DISPOSITION OF SPECIMEN:  N/A  COUNTS:  YES  TOURNIQUET:  * No tourniquets in log *  DICTATION: .Other Dictation: Dictation Number [id number]  PLAN OF CARE: Admit to inpatient   PATIENT DISPOSITION:  PACU - hemodynamically stable.   Delay start of Pharmacological VTE agent (>24hrs) due to surgical blood loss or risk of bleeding: not applicable

## 2011-11-16 NOTE — Anesthesia Postprocedure Evaluation (Addendum)
  Anesthesia Post-op Note  Patient: Ariel Pierce  Procedure(s) Performed: Procedure(s) (LRB): CESAREAN SECTION (N/A)  Patient Location: PACU  Anesthesia Type: Regional  Level of Consciousness: awake, alert  and oriented  Airway and Oxygen Therapy: Patient Spontanous Breathing  Post-op Pain: none  Post-op Assessment: Post-op Vital signs reviewed, Patient's Cardiovascular Status Stable, Respiratory Function Stable, Patent Airway, No signs of Nausea or vomiting, Pain level controlled, No headache and No backache  Post-op Vital Signs: Reviewed and stable  Complications: No apparent anesthesia complications

## 2011-11-17 ENCOUNTER — Encounter (HOSPITAL_COMMUNITY): Payer: Self-pay | Admitting: *Deleted

## 2011-11-17 LAB — CBC
Hemoglobin: 9.9 g/dL — ABNORMAL LOW (ref 12.0–15.0)
RBC: 3.08 MIL/uL — ABNORMAL LOW (ref 3.87–5.11)
WBC: 18.7 10*3/uL — ABNORMAL HIGH (ref 4.0–10.5)

## 2011-11-17 NOTE — Progress Notes (Signed)
Subjective: Postpartum Day 1: Cesarean Delivery Patient reports tolerating PO.  C/o numbness in R lateral  thigh  Objective: Vital signs in last 24 hours: Temp:  [97.9 F (36.6 C)-99.9 F (37.7 C)] 97.9 F (36.6 C) (07/26 0630) Pulse Rate:  [55-149] 96  (07/26 0630) Resp:  [16-20] 20  (07/26 0630) BP: (92-151)/(40-91) 102/63 mmHg (07/26 0630) SpO2:  [96 %-100 %] 96 % (07/26 0630)  Physical Exam:  General: alert and cooperative Lochia: appropriate Uterine Fundus: firm, non- tender Incision: healing well DVT Evaluation: No evidence of DVT seen on physical exam. Foley removed, has not voided at the time of rounds Abd soft, decreased BS   Basename 11/17/11 0507 11/16/11 0829  HGB 9.9* 12.3  HCT 29.1* 36.7    Assessment/Plan: Status post Cesarean section. Doing well postoperatively.  Continue current care Increase warm, beverages. CBC in am  Fredrik Mogel G 11/17/2011, 7:44 AM

## 2011-11-17 NOTE — Progress Notes (Signed)
Dr. Henderson Cloud called due to pt being concerned about areas on abdomen. In LUQ and upper midline there were "gas pockets" and pt wanted to make sure everything was okay. Dr. Henderson Cloud said they must be intra-uterine gas pockets and he will be by to see her later on today.

## 2011-11-17 NOTE — Addendum Note (Signed)
Addendum  created 11/17/11 0934 by Armanda Heritage, RN   Modules edited:Charges VN, Notes Section

## 2011-11-17 NOTE — Op Note (Signed)
Ariel Pierce, Ariel Pierce            ACCOUNT NO.:  [id number]  MEDICAL RECORD NO.:  [id number]  LOCATION:  9101                          FACILITY:  [facility]  PHYSICIAN:  Virgie Kunda L. Armetta [name], M.D.DATE OF BIRTH:  [birth year]  DATE OF PROCEDURE:  11/16/2011 DATE OF DISCHARGE:                              OPERATIVE REPORT   PREOPERATIVE DIAGNOSES:  Failure to progress and chorioamnionitis.  POSTOPERATIVE DIAGNOSES:  Failure to progress and chorioamnionitis.  PROCEDURE:  Primary low-transverse cesarean section.  SURGEON:  Samaa Ueda L. Keniyah Gelinas, MD  ANESTHESIA:  Epidural.  EBL:  Less than 500 mL.  COMPLICATIONS:  None.  PROCEDURE:  The patient was taken to the operating room after she was prepped and draped.  She had been counseled about the risks of the procedure.  A low-transverse incision was made, carried down to the fascia.  Fascia was scored in the midline and extended laterally. Rectus muscles were separated in the midline.  The peritoneum was entered bluntly.  The peritoneal incision was stretched.  The bladder blade was inserted, the lower uterine segment was identified and the bladder flap was created sharply and then digitally.  The bladder blade was then readjusted.  A low-transverse incision was made in the uterus. The uterus was distended using hemostat.  The ear was visible at the incision and consistent with a transverse position, which is one of the reasons I think she failed to progress in labor.  The baby's head was rotated easily and delivered with a vacuum extractor.  The baby was large for gestational age.  The baby's Apgars were noted to be 9 and 9. The cord was clamped and cut and baby was handed to the awaiting neonatal team.  There was some meconium-stained fluid noted at the time of delivery, but we had noted that at time of AROM this morning.  The placenta was manually removed after cord blood was obtained and it was noted to be normal intact for the three-vessel  cord.  The uterus was exteriorized and cleared of all clots and debris.  The uterine incision was closed in 2 layers using 0 chromic in a running locked stitch. Irrigation was performed.  Uterus was returned to the abdomen. Irrigation was performed again and hemostasis was excellent.  The peritoneum was closed using 0 Vicryl.  The rectus muscles were reapproximated using 0 Vicryl.  The fascia was closed using 0 Vicryl in a running stitch.  After irrigation of the subcutaneous layer, the skin was closed using a 4-0 Vicryl on a Keith needle.  Dermabond was applied.  All sponge, lap, and instrument counts were correct x2.  The patient went to recovery room in stable condition.     Landa Mullinax L. Vincente Poli, M.D.     Florestine Avers  D:  11/16/2011  T:  11/17/2011  Job:  [id number]

## 2011-11-17 NOTE — Anesthesia Postprocedure Evaluation (Signed)
  Anesthesia Post-op Note  Patient: Ariel Pierce  Procedure(s) Performed: Procedure(s) (LRB): CESAREAN SECTION (N/A)  Patient Location: PACU and Mother/Baby  Anesthesia Type: Epidural  Level of Consciousness: awake, alert  and oriented  Airway and Oxygen Therapy: Patient Spontanous Breathing  Post-op Pain: mild  Post-op Assessment: Post-op Vital signs reviewed  Post-op Vital Signs: Reviewed and stable  Complications: No apparent anesthesia complications

## 2011-11-17 NOTE — Anesthesia Postprocedure Evaluation (Signed)
  Anesthesia Post-op Note  Patient: Ariel Pierce  Procedure(s) Performed: Procedure(s) (LRB): CESAREAN SECTION (N/A)  Patient Location: PACU and Mother/Baby  Anesthesia Type: Epidural  Level of Consciousness: awake, alert  and oriented  Airway and Oxygen Therapy: Patient Spontanous Breathing  Post-op Pain: mild  Post-op Assessment: Post-op Vital signs reviewed  Post-op Vital Signs: Reviewed and stable  Complications: No apparent anesthesia complications 

## 2011-11-17 NOTE — Progress Notes (Signed)
CTSP about bulge in upper abdomen noted by nurse.  Patient without complaint. Good pain relief, no N/V.  VSS Afeb  Abd: soft, good BS, Dressing C&D          Patient points to diastasis of rectus muscle  A: rectus diastasis  P: patient reassured

## 2011-11-18 ENCOUNTER — Encounter (HOSPITAL_COMMUNITY): Payer: Self-pay | Admitting: Obstetrics and Gynecology

## 2011-11-18 LAB — CBC WITH DIFFERENTIAL/PLATELET
Eosinophils Relative: 2 % (ref 0–5)
HCT: 27.4 % — ABNORMAL LOW (ref 36.0–46.0)
Lymphocytes Relative: 17 % (ref 12–46)
Lymphs Abs: 1.8 10*3/uL (ref 0.7–4.0)
MCV: 92.9 fL (ref 78.0–100.0)
Monocytes Absolute: 1.3 10*3/uL — ABNORMAL HIGH (ref 0.1–1.0)
RBC: 2.95 MIL/uL — ABNORMAL LOW (ref 3.87–5.11)
WBC: 10.9 10*3/uL — ABNORMAL HIGH (ref 4.0–10.5)

## 2011-11-18 MED ORDER — OXYCODONE-ACETAMINOPHEN 5-325 MG PO TABS: 1.0000 | ORAL_TABLET | Freq: Four times a day (QID) | ORAL | Status: AC | PRN

## 2011-11-18 MED ORDER — IBUPROFEN 600 MG PO TABS: 600.0000 mg | ORAL_TABLET | Freq: Four times a day (QID) | ORAL | Status: AC

## 2011-11-18 NOTE — Anesthesia Postprocedure Evaluation (Signed)
  Anesthesia Post-op Note  Patient: Ariel Pierce  Procedure(s) Performed: Procedure(s) (LRB): CESAREAN SECTION (N/A)  Patient Location: Mother/Baby  Anesthesia Type: Regional  Level of Consciousness: awake, alert  and oriented  Airway and Oxygen Therapy: Patient Spontanous Breathing  Post-op Pain: mild  Post-op Assessment: Patient's Cardiovascular Status Stable and Respiratory Function Stable  Post-op Vital Signs: stable  Complications: No apparent anesthesia complications Pt. Still has numbness right outer thigh area.  No problems ambulating.  Told that it may take several days to completely resolve but to call anesthesia if concerns or other symptoms appeared.  To be discharged today.  Dr. Cristela Blue aware.

## 2011-11-18 NOTE — Progress Notes (Signed)
Wants to go home Passing flatus, voiding, good pain relief  VSS Afeb  Abd soft        Incision healing well  A: Satisfactory  P: D/C home     Instructions given

## 2011-11-18 NOTE — Discharge Summary (Signed)
Obstetric Discharge Summary Reason for Admission: chorioamnionitis Prenatal Procedures: ultrasound Intrapartum Procedures: cesarean: low cervical, transverse Postpartum Procedures: none Complications-Operative and Postpartum: none Hemoglobin  Date Value Range Status  11/18/2011 9.2* 12.0 - 15.0 g/dL Final     HCT  Date Value Range Status  11/18/2011 27.4* 36.0 - 46.0 % Final    Physical Exam:  General: alert, cooperative and no distress Lochia: appropriate Uterine Fundus: firm Incision: healing well DVT Evaluation: No evidence of DVT seen on physical exam.  Discharge Diagnoses: Term Pregnancy-delivered and Amnionitis  Discharge Information: Date: 11/18/2011 Activity: pelvic rest Diet: routine Medications: PNV, Ibuprofen and Percocet Condition: stable Instructions: refer to practice specific booklet Discharge to: home   Newborn Data: Live born female  Birth Weight: 10 lb 9 oz (4791 g) APGAR: 9, 9  Home with mother.  Kaleeyah Cuffie II,Marita Burnsed E 11/18/2011, 8:25 AM

## 2011-11-18 NOTE — Addendum Note (Signed)
Addendum  created 11/18/11 1052 by Earmon Phoenix, CRNA   Modules edited:Notes Section

## 2011-11-19 ENCOUNTER — Inpatient Hospital Stay (HOSPITAL_COMMUNITY): Admission: RE | Admit: 2011-11-19 | Payer: BC Managed Care – PPO | Source: Ambulatory Visit

## 2012-09-13 ENCOUNTER — Ambulatory Visit: Payer: BC Managed Care – PPO | Admitting: Family Medicine

## 2012-09-13 ENCOUNTER — Ambulatory Visit: Payer: BC Managed Care – PPO

## 2012-09-13 VITALS — BP 128/76 | HR 70 | Temp 98.6°F | Resp 17 | Ht 65.5 in | Wt 142.0 lb

## 2012-09-13 DIAGNOSIS — K5901 Slow transit constipation: Secondary | ICD-10-CM

## 2012-09-13 DIAGNOSIS — K5900 Constipation, unspecified: Secondary | ICD-10-CM

## 2012-09-13 DIAGNOSIS — K921 Melena: Secondary | ICD-10-CM

## 2012-09-13 DIAGNOSIS — M545 Low back pain: Secondary | ICD-10-CM

## 2012-09-13 LAB — POCT URINALYSIS DIPSTICK
Bilirubin, UA: NEGATIVE
Glucose, UA: NEGATIVE
Nitrite, UA: NEGATIVE
Spec Grav, UA: 1.025
Urobilinogen, UA: 0.2

## 2012-09-13 LAB — POCT CBC
Granulocyte percent: 64.9 %G (ref 37–80)
MCH, POC: 30.6 pg (ref 27–31.2)
MID (cbc): 0.4 (ref 0–0.9)
MPV: 9.5 fL (ref 0–99.8)
POC LYMPH PERCENT: 25.3 %L (ref 10–50)
POC MID %: 9.8 %M (ref 0–12)
Platelet Count, POC: 133 10*3/uL — AB (ref 142–424)
RBC: 4.32 M/uL (ref 4.04–5.48)
RDW, POC: 12.5 %
WBC: 4.2 10*3/uL — AB (ref 4.6–10.2)

## 2012-09-13 LAB — POCT UA - MICROSCOPIC ONLY
Casts, Ur, LPF, POC: NEGATIVE
Yeast, UA: NEGATIVE

## 2012-09-13 LAB — IFOBT (OCCULT BLOOD): IFOBT: NEGATIVE

## 2012-09-13 NOTE — Patient Instructions (Signed)
Try using the colace as needed for stool softening.  We will arrange for your to see GI as soon as we can.  We will give you a call about this appt.  In the meantime if anything changes or gets worse please let me know.

## 2012-09-13 NOTE — Progress Notes (Signed)
[facility] [address] [place] [address] [id number]  Date:  09/13/2012   Name:  Ariel Pierce   DOB:  [birth year]   MRN:  [id number]  PCP:  No PCP Per Patient    Chief Complaint: blood in stol, Constipation and Back Pain   History of Present Illness:  Ariel Pierce is a 32 y.o. very pleasant female patient who presents with the following:  She has noted blood in her stool for about one month.  She has been traveling a lot and has not been able to get to the doctor.   She had taken prednisone for 4 days prior to the start of the bleeding.  She has noted bright red blood on the tissue and in the toilet bowl.  It will coat the stool, and there appears to be some mucus. She is not having any bleeding when she is not having a BM.   She feels as through she has strong urges to go to the bathroom, but will sometimes just have some gas and blood.    Her twin brother has UC.  No other family history of IBD.    She has blood every time she has a BM.  She has also noted abnormal stools- she has had "pellets" which she thinks indicates constipation.   She has noted some allergies recently as well.    She has noted some pressure and soreness in her right lower back for the last 2 days.  She recently started working out again and thinks this might be why.  No urinary symptoms.     Her LMP was last week, they are using condoms for contraception.  She had a child via c/section last year.   There are no active problems to display for this patient.   Past Medical History  Diagnosis Date  . Chest pain   . Lupus anticoagulant disorder   . Abnormal Pap smear   . Pregnancy related carpal tunnel syndrome, antepartum   . H/O varicella   . Anxiety     panic attacks  . Allergy     Past Surgical History  Procedure Laterality Date  . No past surgeries    . Leep    . Cesarean section  11/16/2011    Procedure: CESAREAN SECTION;  Surgeon: Jeani Hawking,  MD;  Location: [facility];  Service: Gynecology;  Laterality: N/A;    History  Substance Use Topics  . Smoking status: Former Games developer  . Smokeless tobacco: Not on file  . Alcohol Use: No     Comment: Rarely    Family History  Problem Relation Age of Onset  . Heart disease Father   . Hypertension Maternal Grandmother   . Thrombophlebitis Maternal Grandmother     has had "blood clots"  . Diabetes Cousin     Allergies  Allergen Reactions  . Zyrtec (Cetirizine Hcl) Swelling  . Latex Rash    Medication list has been reviewed and updated.  No current outpatient prescriptions on file prior to visit.   No current facility-administered medications on file prior to visit.    Review of Systems:  As per HPI- otherwise negative.   Physical Examination: Filed Vitals:   09/13/12 0928  BP: 128/76  Pulse: 130  Temp: 98.6 F (37 C)  Resp: 17   Filed Vitals:   09/13/12 0928  Height: 5' 5.5" (1.664 m)  Weight: 142 lb (64.411 kg)   Body mass index is  23.26 kg/(m^2). Ideal Body Weight: Weight in (lb) to have BMI = 25: 152.2  GEN: WDWN, NAD, Non-toxic, A & O x 3, looks well HEENT: Atraumatic, Normocephalic. Neck supple. No masses, No LAD. Ears and Nose: No external deformity. CV: RRR, No M/G/R. No JVD. No thrill. No extra heart sounds. PULM: CTA B, no wheezes, crackles, rhonchi. No retractions. No resp. distress. No accessory muscle use. ABD: S, NT, ND, +BS. No rebound. No HSM. EXTR: No c/c/e NEURO Normal gait.  PSYCH: Normally interactive. Conversant. Not depressed or anxious appearing.  Calm demeanor.  Rectal: normal exam, no stool in vault, no visible fissure of hemorrhoid.   Back: she has minimal muscular tenderness in the right lower back, no CVA tenderness, negative SLR bilaterally, normal LE strength and sensation and DTR.   [facility] reading (PRIMARY) by  Dr. Patsy Lager. Abdominal series: increased stool in colon, otherwise normal   ACUTE ABDOMEN SERIES (ABDOMEN 2 VIEW &  CHEST 1 VIEW)  Comparison: None.  Findings: Frontal view of the chest shows midline trachea and normal heart size. Lungs are clear. No pleural fluid.  Two views of the abdomen show stool in the majority of the colon. No small bowel dilatation. No unexpected radiopaque calculi. Dextroconvex curvature of the thoracolumbar spine may be positional.  IMPRESSION: Bowel gas pattern is indicative of constipation.   Results for orders placed in visit on 09/13/12  POCT CBC      Result Value Range   WBC 4.2 (*) 4.6 - 10.2 K/uL   Lymph, poc 1.1  0.6 - 3.4   POC LYMPH PERCENT 25.3  10 - 50 %L   MID (cbc) 0.4  0 - 0.9   POC MID % 9.8  0 - 12 %M   POC Granulocyte 2.7  2 - 6.9   Granulocyte percent 64.9  37 - 80 %G   RBC 4.32  4.04 - 5.48 M/uL   Hemoglobin 13.2  12.2 - 16.2 g/dL   HCT, POC 40.9  81.1 - 47.9 %   MCV 96.1  80 - 97 fL   MCH, POC 30.6  27 - 31.2 pg   MCHC 31.8  31.8 - 35.4 g/dL   RDW, POC 91.4     Platelet Count, POC 133 (*) 142 - 424 K/uL   MPV 9.5  0 - 99.8 fL  IFOBT (OCCULT BLOOD)      Result Value Range   IFOBT Negative    POCT UA - MICROSCOPIC ONLY      Result Value Range   WBC, Ur, HPF, POC 0-2     RBC, urine, microscopic 0-1     Bacteria, U Microscopic trace     Mucus, UA trace     Epithelial cells, urine per micros 1-3     Crystals, Ur, HPF, POC neg     Casts, Ur, LPF, POC neg     Yeast, UA neg    POCT URINALYSIS DIPSTICK      Result Value Range   Color, UA yellow     Clarity, UA clear     Glucose, UA neg     Bilirubin, UA neg     Ketones, UA neg     Spec Grav, UA 1.025     Blood, UA neg     pH, UA 5.5     Protein, UA neg     Urobilinogen, UA 0.2     Nitrite, UA neg     Leukocytes, UA Negative  Assessment and Plan: Unspecified constipation - Plan: DG Abd Acute W/Chest, Ambulatory referral to Gastroenterology, CANCELED: DG Abd 2 Views  Blood in the stool - Plan: POCT CBC, IFOBT POC (occult bld, rslt in office), DG Abd Acute W/Chest,  Ambulatory referral to Gastroenterology  Lower back pain - Plan: POCT UA - Microscopic Only, POCT urinalysis dipstick, DG Abd Acute W/Chest  Suspect constipation causing her hard stools.  She has colace at home and will use this.  She may have UC- certainly needs to see GI.  appt made for her to see DR. Mann next week and she is aware.  In the meantime she will let me know if any problems.  Recommend recheck CBC in about one month.   Signed Abbe Amsterdam, MD

## 2012-09-19 LAB — HEPATIC FUNCTION PANEL
Bilirubin, Direct: 0.1 mg/dL (ref 0.01–0.4)
Bilirubin, Total: 0.2 mg/dL

## 2012-09-19 LAB — BASIC METABOLIC PANEL
BUN: 13 mg/dL (ref 4–21)
Creatinine: 0.7 mg/dL (ref 0.5–1.1)
Glucose: 84 mg/dL

## 2012-10-17 ENCOUNTER — Encounter: Payer: Self-pay | Admitting: Family Medicine

## 2012-11-05 ENCOUNTER — Encounter: Payer: Self-pay | Admitting: *Deleted

## 2012-11-06 ENCOUNTER — Encounter: Payer: Self-pay | Admitting: Family Medicine

## 2012-12-26 DIAGNOSIS — K5190 Ulcerative colitis, unspecified, without complications: Secondary | ICD-10-CM | POA: Insufficient documentation

## 2012-12-26 DIAGNOSIS — L409 Psoriasis, unspecified: Secondary | ICD-10-CM | POA: Insufficient documentation

## 2013-01-07 DIAGNOSIS — L4050 Arthropathic psoriasis, unspecified: Secondary | ICD-10-CM | POA: Insufficient documentation

## 2013-09-27 ENCOUNTER — Ambulatory Visit: Payer: BC Managed Care – PPO | Admitting: Family Medicine

## 2013-09-27 VITALS — BP 98/56 | HR 123 | Temp 99.1°F | Resp 16 | Ht 66.0 in | Wt 137.8 lb

## 2013-09-27 DIAGNOSIS — R51 Headache: Secondary | ICD-10-CM

## 2013-09-27 DIAGNOSIS — M2550 Pain in unspecified joint: Secondary | ICD-10-CM

## 2013-09-27 DIAGNOSIS — R509 Fever, unspecified: Secondary | ICD-10-CM

## 2013-09-27 DIAGNOSIS — M791 Myalgia, unspecified site: Secondary | ICD-10-CM

## 2013-09-27 DIAGNOSIS — IMO0001 Reserved for inherently not codable concepts without codable children: Secondary | ICD-10-CM

## 2013-09-27 LAB — POCT URINALYSIS DIPSTICK
BILIRUBIN UA: NEGATIVE
Glucose, UA: NEGATIVE
KETONES UA: NEGATIVE
Leukocytes, UA: NEGATIVE
Nitrite, UA: NEGATIVE
PROTEIN UA: NEGATIVE
Urobilinogen, UA: 0.2
pH, UA: 6

## 2013-09-27 LAB — POCT UA - MICROSCOPIC ONLY
Casts, Ur, LPF, POC: NEGATIVE
Crystals, Ur, HPF, POC: NEGATIVE
MUCUS UA: NEGATIVE
Yeast, UA: NEGATIVE

## 2013-09-27 LAB — POCT CBC
GRANULOCYTE PERCENT: 81.2 % — AB (ref 37–80)
HEMATOCRIT: 44.2 % (ref 37.7–47.9)
Hemoglobin: 14.1 g/dL (ref 12.2–16.2)
LYMPH, POC: 0.7 (ref 0.6–3.4)
MCH, POC: 30.7 pg (ref 27–31.2)
MCHC: 31.9 g/dL (ref 31.8–35.4)
MCV: 96.2 fL (ref 80–97)
MID (cbc): 0.5 (ref 0–0.9)
MPV: 10.5 fL (ref 0–99.8)
POC GRANULOCYTE: 5.5 (ref 2–6.9)
POC LYMPH PERCENT: 10.9 %L (ref 10–50)
POC MID %: 7.9 %M (ref 0–12)
Platelet Count, POC: 177 10*3/uL (ref 142–424)
RBC: 4.59 M/uL (ref 4.04–5.48)
RDW, POC: 13.2 %
WBC: 6.8 10*3/uL (ref 4.6–10.2)

## 2013-09-27 NOTE — Patient Instructions (Signed)
This is a nonspecific viral problem you have. Take Tylenol or ibuprofen and get enough rest and drink plenty of fluids. If something is changing get back to [place] promptly.

## 2013-09-27 NOTE — Progress Notes (Signed)
Subjective: 33 year old lady who has been ill since yesterday. Yesterday evening she developed a lot of body aches in her joints and shoulders and neck and a headache. This persisted in the night with not resting well due to the aches. She had a temperature 100.4 it home this morning. She just doesn't feel well. She has not had any exposure to significant infection that she knows of, though her 34 year-old child a couple of weeks ago. No known tick exposure. She does not a lot outdoors. She has a job as a IT trainerCPA, behind a Scientist, forensicdesk all day. Last menstrual period was couple weeks ago, not pregnant. No one else at home is ill. No nausea vomiting and diarrhea. She did feel this way when she had a urinary tract infection a couple years ago when she was pregnant. However she has not had any dysuria.  Objective: Pleasant alert healthy appearing lady in no major distress. TMs normal. Throat clear. Neck supple without significant nodes. Gets some pain on flexion of her neck and extension of her neck. Her shoulders and opted for tender. Chest clear. Heart regular without murmurs. Abdomen soft without masses or tenderness extremities unremarkable. 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  Assessment: Fever Myalgias and arthralgias Probable viral syndrome  Plan: Check CBC and urinalysis  Results for orders placed in visit on 09/27/13  POCT CBC      Result Value Ref Range   WBC 6.8  4.6 - 10.2 K/uL   Lymph, poc 0.7  0.6 - 3.4   POC LYMPH PERCENT 10.9  10 - 50 %L   MID (cbc) 0.5  0 - 0.9   POC MID % 7.9  0 - 12 %M   POC Granulocyte 5.5  2 - 6.9   Granulocyte percent 81.2 (*) 37 - 80 %G   RBC 4.59  4.04 - 5.48 M/uL   Hemoglobin 14.1  12.2 - 16.2 g/dL   HCT, POC 16.144.2  09.637.7 - 47.9 %   MCV 96.2  80 - 97 fL   MCH, POC 30.7  27 - 31.2 pg   MCHC 31.9  31.8 - 35.4 g/dL   RDW, POC 04.513.2     Platelet Count, POC 177  142 - 424 K/uL   MPV 10.5  0 - 99.8 fL  POCT UA - MICROSCOPIC ONLY      Result  Value Ref Range   WBC, Ur, HPF, POC 0-1     RBC, urine, microscopic 0-2     Bacteria, U Microscopic trace     Mucus, UA neg     Epithelial cells, urine per micros 8-12     Crystals, Ur, HPF, POC neg     Casts, Ur, LPF, POC neg     Yeast, UA neg    POCT URINALYSIS DIPSTICK      Result Value Ref Range   Color, UA yellow     Clarity, UA clear     Glucose, UA neg     Bilirubin, UA neg     Ketones, UA neg     Spec Grav, UA <=1.005     Blood, UA trace     pH, UA 6.0     Protein, UA neg     Urobilinogen, UA 0.2     Nitrite, UA neg     Leukocytes, UA Negative     This asked like a viral infection. We will just let it run its course. She can treated symptomatically with  Tylenol and ibuprofen. Let us know if further problems.

## 2015-01-07 DIAGNOSIS — A879 Viral meningitis, unspecified: Secondary | ICD-10-CM | POA: Insufficient documentation

## 2015-03-04 LAB — OB RESULTS CONSOLE ABO/RH: RH Type: POSITIVE

## 2015-03-04 LAB — OB RESULTS CONSOLE HEPATITIS B SURFACE ANTIGEN: HEP B S AG: NEGATIVE

## 2015-03-04 LAB — OB RESULTS CONSOLE RUBELLA ANTIBODY, IGM: Rubella: IMMUNE

## 2015-03-04 LAB — OB RESULTS CONSOLE HIV ANTIBODY (ROUTINE TESTING): HIV: NONREACTIVE

## 2015-03-04 LAB — OB RESULTS CONSOLE RPR: RPR: NONREACTIVE

## 2015-03-04 LAB — OB RESULTS CONSOLE ANTIBODY SCREEN: Antibody Screen: NEGATIVE

## 2015-03-04 LAB — OB RESULTS CONSOLE GC/CHLAMYDIA
CHLAMYDIA, DNA PROBE: NEGATIVE
GC PROBE AMP, GENITAL: NEGATIVE

## 2015-03-09 ENCOUNTER — Encounter (HOSPITAL_COMMUNITY): Payer: Self-pay

## 2015-03-09 ENCOUNTER — Ambulatory Visit (HOSPITAL_COMMUNITY)
Admission: RE | Admit: 2015-03-09 | Discharge: 2015-03-09 | Disposition: A | Discharge status: Home / Self Care | Payer: BLUE CROSS/BLUE SHIELD | Source: Ambulatory Visit | Attending: Obstetrics and Gynecology | Admitting: Obstetrics and Gynecology

## 2015-03-09 VITALS — BP 111/60 | HR 79 | Wt 151.8 lb

## 2015-03-09 DIAGNOSIS — O09521 Supervision of elderly multigravida, first trimester: Secondary | ICD-10-CM | POA: Diagnosis not present

## 2015-03-09 DIAGNOSIS — Z8661 Personal history of infections of the central nervous system: Secondary | ICD-10-CM | POA: Diagnosis not present

## 2015-03-09 DIAGNOSIS — K5190 Ulcerative colitis, unspecified, without complications: Secondary | ICD-10-CM | POA: Insufficient documentation

## 2015-03-09 DIAGNOSIS — O26891 Other specified pregnancy related conditions, first trimester: Secondary | ICD-10-CM | POA: Diagnosis not present

## 2015-03-09 DIAGNOSIS — O09522 Supervision of elderly multigravida, second trimester: Secondary | ICD-10-CM

## 2015-03-09 DIAGNOSIS — L4050 Arthropathic psoriasis, unspecified: Secondary | ICD-10-CM | POA: Diagnosis not present

## 2015-03-09 DIAGNOSIS — D6862 Lupus anticoagulant syndrome: Secondary | ICD-10-CM | POA: Insufficient documentation

## 2015-03-09 DIAGNOSIS — Z87891 Personal history of nicotine dependence: Secondary | ICD-10-CM | POA: Diagnosis not present

## 2015-03-09 DIAGNOSIS — O99111 Other diseases of the blood and blood-forming organs and certain disorders involving the immune mechanism complicating pregnancy, first trimester: Secondary | ICD-10-CM | POA: Diagnosis not present

## 2015-03-09 NOTE — Consult Note (Signed)
Maternal Fetal Medicine Consultation  Requesting Provider(s): Rocky MorelGretchin Adkins, MD   Reason for consultation: Recent history of viral meningitis, psoriatic arthritis, + Lupus anticoagulant and ulcerative colitis  HPI: Ariel Pierce is a 34 year old G2P1001, EDD 10/12/2015 who is currently at 9w 0d seen for consultation due to a recent history of viral meningitis and a past history of psoriatic arthritis, ulcerative colitis and positive lupus anticoagulant.  The patient reports that she was exposed to chicken pox in late September.  While she reports a history of a "mild case of chickenpox" in the past, she subsequently developed a rash that her primary care physician felt might be consistent with varicella and she was started on Valtrex.  She later developed severe headaches, underwent a lumbar puncture and was diagnosed with a viral meningitis.  HSV and Varicella PCR were negative.  ID felt that the most likely diagnosis was enterovirus.  The patient reports that she had a normal period during her hospitalization and that her pregnancy was diagnosed about a month later. She is doing well following this diagnosis and has not had any further complications.  Ariel Pierce also reports a history of psoriatic arthritis as well as ulcerative colitis.  She was last seen by her Rheumatologist in 2014.  Her biggest complaint has been swollen joints - had otherwise done well.  Her ulcerative colitis is well controlled on canasa suppositories and she denies any recent flares.  She reports that the last time she was seen by her Rheumatologist, they were considering starting biologics but decided not to as she planned on becoming pregnant.  She is without complaints today.  OB History: OB History    Gravida Para Term Preterm AB TAB SAB Ectopic Multiple Living   2 1 1  0 0 0 0 0 0 1      PMH:  Past Medical History  Diagnosis Date  . Chest pain   . Lupus anticoagulant disorder (HCC)   . Abnormal Pap smear    . Pregnancy related carpal tunnel syndrome, antepartum   . H/O varicella   . Anxiety     panic attacks  . Allergy   . Ulcerative colitis (HCC)     dx 2014, Dr. Loreta AveMann    PSH:  Past Surgical History  Procedure Laterality Date  . No past surgeries    . Leep    . Cesarean section  11/16/2011    Procedure: CESAREAN SECTION;  Surgeon: Jeani HawkingMichelle L Grewal, MD;  Location: [facility];  Service: Gynecology;  Laterality: N/A;   Meds:  Current Outpatient Prescriptions on File Prior to Encounter  Medication Sig Dispense Refill  . loratadine-pseudoephedrine (CLARITIN-D 24-HOUR) 10-240 MG per 24 hr tablet Take 1 tablet by mouth daily.    . mesalamine (CANASA) 1000 MG suppository Place 1,000 mg rectally at bedtime.    . sertraline (ZOLOFT) 25 MG tablet Take 25 mg by mouth daily.     No current facility-administered medications on file prior to encounter.   Allergies:  Allergies  Allergen Reactions  . Zyrtec [Cetirizine Hcl] Swelling  . Latex Rash   FH:  Family History  Problem Relation Age of Onset  . Heart disease Father   . Hypertension Maternal Grandmother   . Thrombophlebitis Maternal Grandmother     has had "blood clots"  . Diabetes Cousin    Soc:  Social History   Social History  . Marital Status: Married    Spouse Name: N/A  . Number of Children: N/A  .  Years of Education: N/A   Occupational History  . Not on file.   Social History Main Topics  . Smoking status: Former Games developer  . Smokeless tobacco: Not on file  . Alcohol Use: No     Comment: Rarely  . Drug Use: No  . Sexual Activity: Yes    Birth Control/ Protection: Condom   Other Topics Concern  . Not on file   Social History Narrative   Review of Systems: no vaginal bleeding or cramping/contractions, no LOF, no nausea/vomiting. All other systems reviewed and are negative.  PE:   Filed Vitals:   03/09/15 0937  BP: 111/60  Pulse: 79      A/P: 1) Single IUP at 9w 0d  2) Recent history of viral  meningitis - Ariel Pierce was hospitalized in late September / early October for viral meningitis.  Varicella and HSV PCR from the spinal fluid was negative.  Per ID note, feel that the most likely diagnosis is enterovirus.  The patient reports that her last normal period occurred during that hospitalization.  Feel that this event most likely occurred prior to pregnancy and should not have an impact on this gestation.  While she was exposed to varicella, she does report a history a mild case of "chickenpox"  - I do not anticipate any issues with the pregnancy given this history.  3) Psoriatic arthritis, hx of ulcerative colitis, remote history of positive lupus anticoagulant  - Ariel Pierce does not have any clinical features of antiphospholipid antibody syndrome.  Repeat antiphospholipid antibodies were drawn today following the patient's visit.  In the absence of clinical features, would offer baby aspirin throughout the pregnancy if lupus anticoagulant returns positive.  Would also recommend that the patient follow up with her Rheumatologist - she has not been evaluated by them in over 2 years.  Her ulcerative colitis appears to be well controlled with Canasa suppositories.  She denies any recent flares - would recommend GI consultation if her symptoms recur.  4) Advanced maternal age - we briefly discussed risks of aneuploidy.  The patient elected to return for genetic counseling which we will arrange at the time of her scheduled ultrasound.  Recommendations: 1) Repeat antiphospholipid antibodies (drawn today) 2) Detailed anatomy ultrasound at 18 weeks (scheduled) 3) Rheumatology follow up 4) Recommend serial ultrasounds every 4-6 weeks in the 3rd trimester 5) The patient reports a history of a prior LGA newborn - would offer early diabetes screen    Thank you for the opportunity to be a part of the care of Ariel Pierce. Please contact our office if we can be of further assistance.   I spent  approximately 30 minutes with this patient with over 50% of time spent in face-to-face counseling.  Alpha Gula, MD Maternal Fetal Medicine

## 2015-03-11 LAB — BETA-2-GLYCOPROTEIN I ABS, IGG/M/A
Beta-2 Glyco I IgG: <9 GPI IgG units (ref 0–20)
Beta-2-Glycoprotein I IgM: <9 GPI IgM units (ref 0–32)

## 2015-03-11 LAB — HEXAGONAL PHASE PHOSPHOLIPID: Hexagonal Phase Phospholipid: 29 s — ABNORMAL HIGH (ref 0–11)

## 2015-03-11 LAB — LUPUS ANTICOAGULANT PANEL
DRVVT: 41.1 s (ref 0.0–44.0)
PTT Lupus Anticoagulant: 42.6 s — ABNORMAL HIGH (ref 0.0–40.6)

## 2015-03-11 LAB — PTT-LA MIX: PTT-LA MIX: 41.6 s — AB (ref 0.0–40.6)

## 2015-03-11 LAB — CARDIOLIPIN ANTIBODIES, IGG, IGM, IGA
Anticardiolipin IgA: <9 APL U/mL (ref 0–11)
Anticardiolipin IgG: <9 GPL U/mL (ref 0–14)
Anticardiolipin IgM: 10 MPL U/mL (ref 0–12)

## 2015-03-17 ENCOUNTER — Telehealth (HOSPITAL_COMMUNITY): Payer: Self-pay | Admitting: *Deleted

## 2015-03-17 NOTE — Telephone Encounter (Signed)
Called patient and discussed her positive lupus anticoagulant result.  This was discussed with Dr. Claudean SeveranceWhitecar and he recommends that she begin 81mg  daily baby aspirin. Patient verbalized understanding.  She was also made aware of the need for postpartum lovenox should she end up with a cesarean section. This information will also be called to her OB office on Friday as their office is closed now.

## 2015-04-01 ENCOUNTER — Encounter (HOSPITAL_COMMUNITY): Payer: Self-pay

## 2015-05-06 ENCOUNTER — Ambulatory Visit (HOSPITAL_COMMUNITY): Payer: BLUE CROSS/BLUE SHIELD

## 2015-05-11 ENCOUNTER — Encounter (HOSPITAL_COMMUNITY): Payer: Self-pay

## 2015-05-11 ENCOUNTER — Other Ambulatory Visit (HOSPITAL_COMMUNITY): Payer: Self-pay | Admitting: Maternal and Fetal Medicine

## 2015-05-11 ENCOUNTER — Ambulatory Visit (HOSPITAL_COMMUNITY): Payer: BLUE CROSS/BLUE SHIELD

## 2015-05-11 ENCOUNTER — Ambulatory Visit (HOSPITAL_COMMUNITY)
Admission: RE | Admit: 2015-05-11 | Discharge: 2015-05-11 | Disposition: A | Discharge status: Home / Self Care | Payer: BLUE CROSS/BLUE SHIELD | Source: Ambulatory Visit | Attending: Maternal and Fetal Medicine | Admitting: Maternal and Fetal Medicine

## 2015-05-11 ENCOUNTER — Ambulatory Visit (HOSPITAL_COMMUNITY)
Admission: RE | Admit: 2015-05-11 | Discharge: 2015-05-11 | Disposition: A | Discharge status: Home / Self Care | Payer: BLUE CROSS/BLUE SHIELD | Source: Ambulatory Visit | Attending: Obstetrics and Gynecology | Admitting: Obstetrics and Gynecology

## 2015-05-11 DIAGNOSIS — Z36 Encounter for antenatal screening of mother: Secondary | ICD-10-CM | POA: Diagnosis not present

## 2015-05-11 DIAGNOSIS — O34219 Maternal care for unspecified type scar from previous cesarean delivery: Secondary | ICD-10-CM | POA: Diagnosis not present

## 2015-05-11 DIAGNOSIS — O09522 Supervision of elderly multigravida, second trimester: Secondary | ICD-10-CM | POA: Diagnosis not present

## 2015-05-11 DIAGNOSIS — O09292 Supervision of pregnancy with other poor reproductive or obstetric history, second trimester: Secondary | ICD-10-CM

## 2015-05-11 DIAGNOSIS — O26892 Other specified pregnancy related conditions, second trimester: Secondary | ICD-10-CM | POA: Diagnosis not present

## 2015-05-11 DIAGNOSIS — M329 Systemic lupus erythematosus, unspecified: Secondary | ICD-10-CM | POA: Diagnosis not present

## 2015-05-11 DIAGNOSIS — Z3A18 18 weeks gestation of pregnancy: Secondary | ICD-10-CM | POA: Diagnosis not present

## 2015-05-11 DIAGNOSIS — L4050 Arthropathic psoriasis, unspecified: Secondary | ICD-10-CM

## 2015-05-11 DIAGNOSIS — Z1389 Encounter for screening for other disorder: Secondary | ICD-10-CM

## 2015-05-11 DIAGNOSIS — IMO0002 Reserved for concepts with insufficient information to code with codable children: Secondary | ICD-10-CM

## 2015-05-11 DIAGNOSIS — Z363 Encounter for antenatal screening for malformations: Secondary | ICD-10-CM

## 2015-05-11 DIAGNOSIS — Z315 Encounter for genetic counseling: Secondary | ICD-10-CM | POA: Diagnosis not present

## 2015-05-11 DIAGNOSIS — Z98891 History of uterine scar from previous surgery: Secondary | ICD-10-CM

## 2015-05-11 DIAGNOSIS — O09529 Supervision of elderly multigravida, unspecified trimester: Secondary | ICD-10-CM

## 2015-05-13 DIAGNOSIS — O09529 Supervision of elderly multigravida, unspecified trimester: Secondary | ICD-10-CM | POA: Insufficient documentation

## 2015-05-13 NOTE — Progress Notes (Signed)
Genetic Counseling  High-Risk Gestation Note  Appointment Date:  05/11/2015 Referred By: Ariel Cairo, MD Date of Birth:  [birth year]   Pregnancy History: G2P1001 Estimated Date of Delivery: 10/12/15 Estimated Gestational Age: [redacted]w[redacted]d Attending: Particia Nearing, MD   Ariel Pierce was seen for genetic counseling because of a maternal age of 35 y.o..     In Summary:   Age-related risk for fetal aneuploidy approximately 1 in 36  Patient reported she previously had NIPS (Panorama) through her OB office, which was within normal range  Detailed ultrasound performed today and within normal limits  Patient declined amniocentesis  Patient's personal and family history significant for autoimmune conditions; which typically follow multifactorial inheritance  Previous MFM consultation was performed for patient regarding her medical history  She was counseled regarding maternal age and the association with risk for chromosome conditions due to nondisjunction with aging of the ova.   We reviewed chromosomes, nondisjunction, and the associated 1 in 141 risk for fetal aneuploidy related to a maternal age of 34 y.o. at [redacted]w[redacted]d gestation.  She was counseled that the risk for aneuploidy decreases as gestational age increases, accounting for those pregnancies which spontaneously abort.  We specifically discussed Down syndrome (trisomy 22), trisomies 61 and 31, and sex chromosome aneuploidies (47,XXX and 47,XXY) including the common features and prognoses of each.   Ariel Pierce previously had noninvasive prenatal screening (NIPS)/prenatal cell free DNA testing through her OB office. She reported that this testing, Panorama through [facility] laboratory, was low risk for all conditions screened. The lab report was not available to [place] at the time of today's visit to confirm this report. We reviewed the methodology of NIPS, the conditions for which it screens, and the detection rates of each. We also  discussed detailed ultrasound as a screening tool for fetal aneuploidy, including the limits and benefits. She was also counseled regarding diagnostic testing via amniocentesis. We reviewed the approximate 1 in 300-500 risk for complications for amniocentesis, including spontaneous pregnancy loss. After consideration of all the options, she declined amniocentesis, stating she was comfortable with risk assessment from NIPS and ultrasound.   A complete ultrasound was performed today. The ultrasound report will be sent under separate cover. There were no visualized fetal anomalies or markers suggestive of aneuploidy. She understands that screening tests cannot rule out all birth defects or genetic syndromes. The patient was advised of this limitation and states she still does not want additional testing at this time.   Ariel Pierce  was provided with written information regarding cystic fibrosis (CF) including the carrier frequency and incidence in the Caucasian population, the availability of carrier testing and prenatal diagnosis if indicated.  In addition, we discussed that CF is routinely screened for as part of the [place] newborn screening panel.  She reported that she previously had CF carrier screening, which was negative. This would reduce her risk to be a CF carrier.    Both family histories were reviewed and found to be contributory for autoimmune conditions. The patient reported a personal history of ulcerative colitis, psoriatic arthritis, and lupus. Her twin brother reportedly has ulcerative colitis. The father of the pregnancy has a sister with lupus. We discussed that autoimmune conditions occur when an individual's body launches an abnormal immune response and begins to destroy its own cells. There are several different autoimmune conditions. While we do know that autoimmune conditions tend to "cluster" within a family, they do not follow a clear pattern of inheritance. When there  is a  person in the family with an autoimmune condition, there is an increased chance for others in the family to develop an autoimmune condition, but it may be a different condition. Specific risk factor information is not available. Genetic testing is not available at this time to predict who may develop an autoimmune condition.  The couple's son reportedly has ectopia related to one eye and an under bite. He is 36 years old and reportedly healthy. We reviewed that congenital differences of the eye can be isolated or part of an underlying genetic syndrome. When isolated, multifactorial inheritance is most often suspected, though familial cases are reported. Thus, recurrence risk would likely be increased above the general population risk, but an exact risk figure is not able to be quantified without additional information regarding the etiology. Further genetic counseling is warranted if more information is obtained.  Mrs. Pierce denied exposure to environmental toxins or chemical agents. She denied the use of alcohol, tobacco or street drugs. She denied significant viral illnesses during the course of her pregnancy. Her medical and surgical histories were contributory for psoriatic arthritis, ulcerative colitis, and lupus, for which she previously had Maternal Fetal Medicine consultation to discuss. See previous consult note from 03/09/15 for detailed discussion.    I counseled [name] regarding the above risks and available options.  The approximate face-to-face time with the genetic counselor was 35 minutes. Most of the counseling was provided by Ariel Pierce, [employer] genetic counseling student, under my direct supervision.   Ariel Plowman, MS,  Certified Genetic Counselor 05/13/2015

## 2015-05-14 ENCOUNTER — Ambulatory Visit (HOSPITAL_COMMUNITY): Payer: BLUE CROSS/BLUE SHIELD

## 2015-05-18 ENCOUNTER — Inpatient Hospital Stay (HOSPITAL_COMMUNITY)
Admission: AD | Admit: 2015-05-18 | Discharge: 2015-05-18 | Disposition: A | Discharge status: Home / Self Care | Payer: BLUE CROSS/BLUE SHIELD | Source: Ambulatory Visit | Attending: Obstetrics and Gynecology | Admitting: Obstetrics and Gynecology

## 2015-05-18 ENCOUNTER — Encounter (HOSPITAL_COMMUNITY): Payer: Self-pay | Admitting: *Deleted

## 2015-05-18 DIAGNOSIS — Z3A19 19 weeks gestation of pregnancy: Secondary | ICD-10-CM | POA: Insufficient documentation

## 2015-05-18 DIAGNOSIS — O99612 Diseases of the digestive system complicating pregnancy, second trimester: Secondary | ICD-10-CM | POA: Diagnosis not present

## 2015-05-18 DIAGNOSIS — Z87891 Personal history of nicotine dependence: Secondary | ICD-10-CM | POA: Insufficient documentation

## 2015-05-18 DIAGNOSIS — R109 Unspecified abdominal pain: Secondary | ICD-10-CM | POA: Diagnosis present

## 2015-05-18 DIAGNOSIS — A084 Viral intestinal infection, unspecified: Secondary | ICD-10-CM | POA: Diagnosis not present

## 2015-05-18 DIAGNOSIS — O219 Vomiting of pregnancy, unspecified: Secondary | ICD-10-CM

## 2015-05-18 LAB — URINALYSIS, ROUTINE W REFLEX MICROSCOPIC
Bilirubin Urine: NEGATIVE
GLUCOSE, UA: NEGATIVE mg/dL
Ketones, ur: 40 mg/dL — AB
LEUKOCYTES UA: NEGATIVE
Nitrite: NEGATIVE
PH: 6 (ref 5.0–8.0)
PROTEIN: NEGATIVE mg/dL
Specific Gravity, Urine: 1.02 (ref 1.005–1.030)

## 2015-05-18 LAB — COMPREHENSIVE METABOLIC PANEL
ALT: 14 U/L (ref 14–54)
AST: 21 U/L (ref 15–41)
Albumin: 3 g/dL — ABNORMAL LOW (ref 3.5–5.0)
Alkaline Phosphatase: 56 U/L (ref 38–126)
Anion gap: 11 (ref 5–15)
BILIRUBIN TOTAL: 0.4 mg/dL (ref 0.3–1.2)
BUN: 6 mg/dL (ref 6–20)
CALCIUM: 8.9 mg/dL (ref 8.9–10.3)
CHLORIDE: 106 mmol/L (ref 101–111)
CO2: 20 mmol/L — ABNORMAL LOW (ref 22–32)
Creatinine, Ser: 0.41 mg/dL — ABNORMAL LOW (ref 0.44–1.00)
GFR calc non Af Amer: >60 mL/min (ref >60)
Glucose, Bld: 110 mg/dL — ABNORMAL HIGH (ref 65–99)
Potassium: 3.3 mmol/L — ABNORMAL LOW (ref 3.5–5.1)
Sodium: 137 mmol/L (ref 135–145)
Total Protein: 5.4 g/dL — ABNORMAL LOW (ref 6.5–8.1)

## 2015-05-18 LAB — CBC
HCT: 34.9 % — ABNORMAL LOW (ref 36.0–46.0)
Hemoglobin: 12 g/dL (ref 12.0–15.0)
MCH: 30.9 pg (ref 26.0–34.0)
MCHC: 34.4 g/dL (ref 30.0–36.0)
MCV: 89.9 fL (ref 78.0–100.0)
PLATELETS: 170 10*3/uL (ref 150–400)
RBC: 3.88 MIL/uL (ref 3.87–5.11)
RDW: 13.1 % (ref 11.5–15.5)
WBC: 10.2 10*3/uL (ref 4.0–10.5)

## 2015-05-18 LAB — URINE MICROSCOPIC-ADD ON

## 2015-05-18 MED ORDER — LACTATED RINGERS IV BOLUS (SEPSIS)
1000.0000 mL | Freq: Once | INTRAVENOUS | Status: AC
  Administered 2015-05-18: 1000 mL via INTRAVENOUS

## 2015-05-18 MED ORDER — DEXTROSE 5 % IN LACTATED RINGERS IV BOLUS
1000.0000 mL | Freq: Once | INTRAVENOUS | Status: AC
  Administered 2015-05-18: 1000 mL via INTRAVENOUS

## 2015-05-18 MED ORDER — ONDANSETRON HCL 4 MG PO TABS: 4.0000 mg | ORAL_TABLET | Freq: Three times a day (TID) | ORAL | Status: DC | PRN

## 2015-05-18 MED ORDER — ONDANSETRON HCL 4 MG/2ML IJ SOLN
4.0000 mg | Freq: Once | INTRAMUSCULAR | Status: AC
  Administered 2015-05-18: 4 mg via INTRAVENOUS
  Filled 2015-05-18: qty 2

## 2015-05-18 NOTE — MAU Provider Note (Signed)
Chief Complaint: Diarrhea and Abdominal Cramping   First Provider Initiated Contact with Patient 05/18/15 1318      SUBJECTIVE HPI: Ariel Pierce is a 35 y.o. G2P1001 at [redacted]w[redacted]d by LMP who presents to maternity admissions reporting onset of diarrhea and abdominal cramping yesterday morning.  She reports diarrhea x 3 yesterday, then symptoms started again this morning with diarrhea x 10 today and waves of abdominal pain every 30 minutes.  She has history of ulcerative colitis but usually has constipation, not diarrhea.  Her son had diarrhea 2-3 times a few days ago and she wonders if he had a virus acquired at his daycare.  She has not tried any medications but has tried to drink fluids at home to treat her symptoms.  Nothing seems to make her symptoms better or worse.  She denies vaginal bleeding, vaginal itching/burning, urinary symptoms, h/a, dizziness, vomiting, or fever/chills.     HPI  Past Medical History  Diagnosis Date  . Chest pain   . Lupus anticoagulant disorder (HCC)   . Abnormal Pap smear   . Pregnancy related carpal tunnel syndrome, antepartum   . H/O varicella   . Anxiety     panic attacks  . Allergy   . Ulcerative colitis (HCC)     dx 2014, Dr. Loreta Ave   Past Surgical History  Procedure Laterality Date  . No past surgeries    . Leep    . Cesarean section  11/16/2011    Procedure: CESAREAN SECTION;  Surgeon: Jeani Hawking, MD;  Location: [facility];  Service: Gynecology;  Laterality: N/A;   Social History   Social History  . Marital Status: Married    Spouse Name: N/A  . Number of Children: N/A  . Years of Education: N/A   Occupational History  . Not on file.   Social History Main Topics  . Smoking status: Former Games developer  . Smokeless tobacco: Not on file  . Alcohol Use: No     Comment: Rarely  . Drug Use: No  . Sexual Activity: Yes    Birth Control/ Protection: Condom   Other Topics Concern  . Not on file   Social History Narrative   No current  facility-administered medications on file prior to encounter.   Current Outpatient Prescriptions on File Prior to Encounter  Medication Sig Dispense Refill  . loratadine-pseudoephedrine (CLARITIN-D 24-HOUR) 10-240 MG per 24 hr tablet Take 1 tablet by mouth daily.    . mesalamine (CANASA) 1000 MG suppository Place 1,000 mg rectally at bedtime.    . Prenatal Vit-Fe Fumarate-FA (PRENATAL VITAMIN PO) Take 1 tablet by mouth daily.      Allergies  Allergen Reactions  . Zyrtec [Cetirizine Hcl] Swelling  . Latex Rash    ROS:  Review of Systems  Constitutional: Negative for fever, chills and fatigue.  Respiratory: Negative for shortness of breath.   Cardiovascular: Negative for chest pain.  Gastrointestinal: Positive for nausea, abdominal pain and diarrhea. Negative for vomiting, constipation and blood in stool.  Genitourinary: Positive for pelvic pain. Negative for dysuria, flank pain, vaginal bleeding, vaginal discharge, difficulty urinating and vaginal pain.  Neurological: Negative for dizziness and headaches.  Psychiatric/Behavioral: Negative.      I have reviewed patient's Past Medical Hx, Surgical Hx, Family Hx, Social Hx, medications and allergies.   Physical Exam   Patient Vitals for the past 24 hrs:  BP Temp Temp src Pulse Resp  05/18/15 1645 (!) 110/51 mmHg - Oral 105 16  05/18/15 1211  120/62 mmHg 98 F (36.7 C) - 107 18   Constitutional: Well-developed, well-nourished female in no acute distress.  Cardiovascular: normal rate Respiratory: normal effort GI: Abd soft, non-tender. Pos BS x 4 MS: Extremities nontender, no edema, normal ROM Neurologic: Alert and oriented x 4.  GU: Neg CVAT.  Dilation: Closed Effacement (%): Thick Exam by:: Collene Gobble CNM   FHT 164 by doppler  LAB RESULTS Results for orders placed or performed during the hospital encounter of 05/18/15 (from the past 24 hour(s))  Urinalysis, Routine w reflex microscopic (not at [facility])     Status:  Abnormal   Collection Time: 05/18/15 12:00 PM  Result Value Ref Range   Color, Urine YELLOW YELLOW   APPearance CLEAR CLEAR   Specific Gravity, Urine 1.020 1.005 - 1.030   pH 6.0 5.0 - 8.0   Glucose, UA NEGATIVE NEGATIVE mg/dL   Hgb urine dipstick SMALL (A) NEGATIVE   Bilirubin Urine NEGATIVE NEGATIVE   Ketones, ur 40 (A) NEGATIVE mg/dL   Protein, ur NEGATIVE NEGATIVE mg/dL   Nitrite NEGATIVE NEGATIVE   Leukocytes, UA NEGATIVE NEGATIVE  Urine microscopic-add on     Status: Abnormal   Collection Time: 05/18/15 12:00 PM  Result Value Ref Range   Squamous Epithelial / LPF 0-5 (A) NONE SEEN   WBC, UA 0-5 0 - 5 WBC/hpf   RBC / HPF 0-5 0 - 5 RBC/hpf   Bacteria, UA MANY (A) NONE SEEN   Urine-Other MUCOUS PRESENT   CBC     Status: Abnormal   Collection Time: 05/18/15  1:20 PM  Result Value Ref Range   WBC 10.2 4.0 - 10.5 K/uL   RBC 3.88 3.87 - 5.11 MIL/uL   Hemoglobin 12.0 12.0 - 15.0 g/dL   HCT 16.1 (L) 09.6 - 04.5 %   MCV 89.9 78.0 - 100.0 fL   MCH 30.9 26.0 - 34.0 pg   MCHC 34.4 30.0 - 36.0 g/dL   RDW 40.9 81.1 - 91.4 %   Platelets 170 150 - 400 K/uL  Comprehensive metabolic panel     Status: Abnormal   Collection Time: 05/18/15  1:20 PM  Result Value Ref Range   Sodium 137 135 - 145 mmol/L   Potassium 3.3 (L) 3.5 - 5.1 mmol/L   Chloride 106 101 - 111 mmol/L   CO2 20 (L) 22 - 32 mmol/L   Glucose, Bld 110 (H) 65 - 99 mg/dL   BUN 6 6 - 20 mg/dL   Creatinine, Ser 7.82 (L) 0.44 - 1.00 mg/dL   Calcium 8.9 8.9 - 95.6 mg/dL   Total Protein 5.4 (L) 6.5 - 8.1 g/dL   Albumin 3.0 (L) 3.5 - 5.0 g/dL   AST 21 15 - 41 U/L   ALT 14 14 - 54 U/L   Alkaline Phosphatase 56 38 - 126 U/L   Total Bilirubin 0.4 0.3 - 1.2 mg/dL   GFR calc non Af Amer >60 >60 mL/min   GFR calc Af Amer >60 >60 mL/min   Anion gap 11 5 - 15    MAU Management/MDM: Ordered labs and reviewed results.  Consult Dr Renaldo Fiddler.  Likely viral gastroenteritis.  Treatments in MAU included LR x 1000 ml, D5LR x 1000 ml,  Zofran 4 mg IV.  Pt reports no diarrhea since medication given and IV fluids started but does continue to report waves of cramping that are now 45 minutes apart, changed from 30 minutes apart.  Pt stable at time of discharge.  ASSESSMENT 1. Viral  gastroenteritis   2. Nausea/vomiting in pregnancy     PLAN Discharge home with preterm labor precautions Increase PO fluids Zofran 4 mg PO Q 6-8 hours PRN Imodium OTC safe if needed for diarrhea   Follow-up Information    Follow up with ADKINS,GRETCHEN, MD.   Specialty:  Obstetrics and Gynecology   Why:  As scheduled, Return to MAU as needed for emergencies   Contact information:   [address] [place] [address] [phone]       Please follow up.   Why:  Imodium OTC is safe to use in pregnancy       Sharen Counter Certified Nurse-Midwife 05/18/2015  7:40 PM

## 2015-05-18 NOTE — Discharge Instructions (Signed)

## 2015-05-18 NOTE — MAU Note (Signed)
Pt presents to MAU with complaints of diarrhea and lower abdominal cramping for two days. Denies any vaginal bleeding or abnormal discharge

## 2015-07-21 ENCOUNTER — Institutional Professional Consult (permissible substitution): Payer: BLUE CROSS/BLUE SHIELD | Admitting: Pulmonary Disease

## 2015-09-21 ENCOUNTER — Encounter (HOSPITAL_COMMUNITY): Payer: Self-pay

## 2015-10-04 ENCOUNTER — Encounter (HOSPITAL_COMMUNITY)
Admission: RE | Admit: 2015-10-04 | Discharge: 2015-10-04 | Disposition: A | Discharge status: Home / Self Care | Payer: BLUE CROSS/BLUE SHIELD | Source: Ambulatory Visit | Attending: Obstetrics and Gynecology | Admitting: Obstetrics and Gynecology

## 2015-10-04 HISTORY — DX: Headache: R51

## 2015-10-04 HISTORY — DX: Headache, unspecified: R51.9

## 2015-10-04 HISTORY — DX: Thrombocytopenia, unspecified: O99.119

## 2015-10-04 HISTORY — DX: Other diseases of the blood and blood-forming organs and certain disorders involving the immune mechanism complicating pregnancy, unspecified trimester: D69.6

## 2015-10-04 LAB — CBC
HEMATOCRIT: 37.4 % (ref 36.0–46.0)
HEMOGLOBIN: 12.9 g/dL (ref 12.0–15.0)
MCH: 31.8 pg (ref 26.0–34.0)
MCHC: 34.5 g/dL (ref 30.0–36.0)
MCV: 92.1 fL (ref 78.0–100.0)
Platelets: 117 10*3/uL — ABNORMAL LOW (ref 150–400)
RBC: 4.06 MIL/uL (ref 3.87–5.11)
RDW: 13.9 % (ref 11.5–15.5)
WBC: 11.3 10*3/uL — ABNORMAL HIGH (ref 4.0–10.5)

## 2015-10-04 LAB — TYPE AND SCREEN
ABO/RH(D): O POS
ANTIBODY SCREEN: NEGATIVE

## 2015-10-04 NOTE — Patient Instructions (Signed)
20 Ariel Pierce  10/04/2015   Your procedure is scheduled on:  10/05/2015  Enter through the Main Entrance of [facility] at 1130 AM.  Pick up the phone at the desk and dial [phone].   Call this number if you have problems the morning of surgery: [phone]   Remember:   Do not eat food:After Midnight.  Do not drink clear liquids: After Midnight.  Take these medicines the morning of surgery with A SIP OF WATER: none   Do not wear jewelry, make-up or nail polish.  Do not wear lotions, powders, or perfumes. You may wear deodorant.  Do not shave 48 hours prior to surgery.  Do not bring valuables to the hospital.  [facility] is not   responsible for any belongings or valuables brought to the hospital.  Contacts, dentures or bridgework may not be worn into surgery.  Leave suitcase in the car. After surgery it may be brought to your room.  For patients admitted to the hospital, checkout time is 11:00 AM the day of              discharge.   Patients discharged the day of surgery will not be allowed to drive             home.  Name and phone number of your driver: na  Special Instructions:   Shower using CHG 2 nights before surgery and the night before surgery.  If you shower the day of surgery use CHG.  Use special wash - you have one bottle of CHG for all showers.  You should use approximately 1/3 of the bottle for each shower.   Please read over the following fact sheets that you were given:   Surgical Site Infection Prevention

## 2015-10-04 NOTE — H&P (Addendum)
Ariel Pierce is a 35 y.o. female G2P1 @ 39wks presenting for rpt c-section. History OB History    Gravida Para Term Preterm AB TAB SAB Ectopic Multiple Living   2 1 1  0 0 0 0 0 0 1     Past Medical History  Diagnosis Date  . Chest pain   . Lupus anticoagulant disorder (HCC)   . Abnormal Pap smear   . Pregnancy related carpal tunnel syndrome, antepartum   . H/O varicella   . Anxiety     panic attacks  . Allergy   . Ulcerative colitis (HCC)     dx 2014, Dr. Loreta AveMann  . AMA (advanced maternal age) multigravida 35+   . Vaginal Pap smear, abnormal   . Hx of varicella   . Headache   . Thrombocytopenia complicating pregnancy [facility](HCC)    Past Surgical History  Procedure Laterality Date  . No past surgeries    . Leep    . Cesarean section  11/16/2011    Procedure: CESAREAN SECTION;  Surgeon: Jeani HawkingMichelle L Grewal, MD;  Location: [facility];  Service: Gynecology;  Laterality: N/A;   Family History: family history includes Colitis in her brother; Diabetes in her cousin; Heart disease in her father; Hypertension in her maternal grandmother; Kidney disease in her maternal grandmother; Thrombophlebitis in her maternal grandmother; Thyroid disease in her maternal grandmother. Social History:  reports that she has quit smoking. She has never used smokeless tobacco. She reports that she does not drink alcohol or use illicit drugs.   Prenatal Transfer Tool  Maternal Diabetes: No Genetic Screening: Declined Maternal Ultrasounds/Referrals: Normal Fetal Ultrasounds or other Referrals:  None Maternal Substance Abuse:  No Significant Maternal Medications:  None Significant Maternal Lab Results:  None Other Comments:  None  ROS    Last menstrual period 01/05/2015. Exam Physical Exam  Gen - NAD Abd - gravid, NT Ext - NT, no edema Cv - RRR Lungs - clear Prenatal labs: ABO, Rh: O/Positive/-- (11/10 0000) Antibody: Negative (11/10 0000) Rubella: Immune (11/10 0000) RPR: Nonreactive (11/10  0000)  HBsAg: Negative (11/10 0000)  HIV: Non-reactive (11/10 0000)  GBS:     Assessment/Plan: For repeat c-section  R/b/a discussed, questions answered, informed consent  Ariel Pierce 10/04/2015, 7:13 AM

## 2015-10-05 ENCOUNTER — Encounter (HOSPITAL_COMMUNITY)
Admission: AD | Disposition: A | Discharge status: Home / Self Care | Payer: Self-pay | Source: Ambulatory Visit | Attending: Obstetrics and Gynecology

## 2015-10-05 ENCOUNTER — Inpatient Hospital Stay (HOSPITAL_COMMUNITY): Payer: BLUE CROSS/BLUE SHIELD | Admitting: Anesthesiology

## 2015-10-05 ENCOUNTER — Encounter (HOSPITAL_COMMUNITY): Payer: Self-pay | Admitting: *Deleted

## 2015-10-05 ENCOUNTER — Inpatient Hospital Stay (HOSPITAL_COMMUNITY)
Admission: AD | Admit: 2015-10-05 | Discharge: 2015-10-07 | DRG: 765 | Disposition: A | Discharge status: Home / Self Care | Payer: BLUE CROSS/BLUE SHIELD | Source: Ambulatory Visit | Attending: Obstetrics and Gynecology | Admitting: Obstetrics and Gynecology

## 2015-10-05 DIAGNOSIS — Z6832 Body mass index (BMI) 32.0-32.9, adult: Secondary | ICD-10-CM

## 2015-10-05 DIAGNOSIS — O99344 Other mental disorders complicating childbirth: Secondary | ICD-10-CM | POA: Diagnosis present

## 2015-10-05 DIAGNOSIS — O99214 Obesity complicating childbirth: Secondary | ICD-10-CM | POA: Diagnosis present

## 2015-10-05 DIAGNOSIS — K219 Gastro-esophageal reflux disease without esophagitis: Secondary | ICD-10-CM | POA: Diagnosis present

## 2015-10-05 DIAGNOSIS — O9962 Diseases of the digestive system complicating childbirth: Secondary | ICD-10-CM | POA: Diagnosis present

## 2015-10-05 DIAGNOSIS — Z3A39 39 weeks gestation of pregnancy: Secondary | ICD-10-CM | POA: Diagnosis not present

## 2015-10-05 DIAGNOSIS — D6862 Lupus anticoagulant syndrome: Secondary | ICD-10-CM | POA: Diagnosis present

## 2015-10-05 DIAGNOSIS — Z833 Family history of diabetes mellitus: Secondary | ICD-10-CM

## 2015-10-05 DIAGNOSIS — E669 Obesity, unspecified: Secondary | ICD-10-CM | POA: Diagnosis present

## 2015-10-05 DIAGNOSIS — Z8249 Family history of ischemic heart disease and other diseases of the circulatory system: Secondary | ICD-10-CM

## 2015-10-05 DIAGNOSIS — Z87891 Personal history of nicotine dependence: Secondary | ICD-10-CM

## 2015-10-05 DIAGNOSIS — F419 Anxiety disorder, unspecified: Secondary | ICD-10-CM | POA: Diagnosis present

## 2015-10-05 DIAGNOSIS — Z98891 History of uterine scar from previous surgery: Secondary | ICD-10-CM

## 2015-10-05 DIAGNOSIS — O9912 Other diseases of the blood and blood-forming organs and certain disorders involving the immune mechanism complicating childbirth: Secondary | ICD-10-CM | POA: Diagnosis present

## 2015-10-05 DIAGNOSIS — G5600 Carpal tunnel syndrome, unspecified upper limb: Secondary | ICD-10-CM | POA: Diagnosis present

## 2015-10-05 DIAGNOSIS — O34211 Maternal care for low transverse scar from previous cesarean delivery: Secondary | ICD-10-CM | POA: Diagnosis present

## 2015-10-05 HISTORY — DX: Unspecified osteoarthritis, unspecified site: M19.90

## 2015-10-05 LAB — CBC
HCT: 36.4 % (ref 36.0–46.0)
Hemoglobin: 12.4 g/dL (ref 12.0–15.0)
MCH: 31.6 pg (ref 26.0–34.0)
MCHC: 34.1 g/dL (ref 30.0–36.0)
MCV: 92.9 fL (ref 78.0–100.0)
PLATELETS: 108 10*3/uL — AB (ref 150–400)
RBC: 3.92 MIL/uL (ref 3.87–5.11)
RDW: 14 % (ref 11.5–15.5)
WBC: 8.2 10*3/uL (ref 4.0–10.5)

## 2015-10-05 LAB — RPR: RPR: NONREACTIVE

## 2015-10-05 SURGERY — Surgical Case
Anesthesia: Spinal

## 2015-10-05 MED ORDER — OXYTOCIN 10 UNIT/ML IJ SOLN
40.0000 [IU] | INTRAMUSCULAR | Status: DC | PRN
  Administered 2015-10-05: 40 [IU] via INTRAVENOUS

## 2015-10-05 MED ORDER — DIPHENHYDRAMINE HCL 25 MG PO CAPS: 25.0000 mg | ORAL_CAPSULE | Freq: Four times a day (QID) | ORAL | Status: DC | PRN

## 2015-10-05 MED ORDER — PHENYLEPHRINE 8 MG IN D5W 100 ML (0.08MG/ML) PREMIX OPTIME
INJECTION | INTRAVENOUS | Status: DC | PRN
  Administered 2015-10-05: 60 ug/min via INTRAVENOUS

## 2015-10-05 MED ORDER — LACTATED RINGERS IV SOLN
INTRAVENOUS | Status: DC
  Administered 2015-10-05 (×2): via INTRAVENOUS

## 2015-10-05 MED ORDER — CEFAZOLIN SODIUM-DEXTROSE 2-4 GM/100ML-% IV SOLN
2.0000 g | INTRAVENOUS | Status: AC
  Administered 2015-10-05: 2 g via INTRAVENOUS

## 2015-10-05 MED ORDER — DIPHENHYDRAMINE HCL 50 MG/ML IJ SOLN: 12.5000 mg | INTRAMUSCULAR | Status: DC | PRN

## 2015-10-05 MED ORDER — FENTANYL CITRATE (PF) 100 MCG/2ML IJ SOLN
25.0000 ug | INTRAMUSCULAR | Status: DC | PRN
  Administered 2015-10-05: 50 ug via INTRAVENOUS

## 2015-10-05 MED ORDER — SODIUM CHLORIDE 0.9% FLUSH: 3.0000 mL | INTRAVENOUS | Status: DC | PRN

## 2015-10-05 MED ORDER — BUPIVACAINE IN DEXTROSE 0.75-8.25 % IT SOLN
INTRATHECAL | Status: DC | PRN
  Administered 2015-10-05: 1.5 mL via INTRATHECAL

## 2015-10-05 MED ORDER — OXYTOCIN 10 UNIT/ML IJ SOLN
INTRAMUSCULAR | Status: AC
  Filled 2015-10-05: qty 4

## 2015-10-05 MED ORDER — PRENATAL MULTIVITAMIN CH
1.0000 | ORAL_TABLET | Freq: Every day | ORAL | Status: DC
  Administered 2015-10-06 – 2015-10-07 (×2): 1 via ORAL
  Filled 2015-10-05 (×2): qty 1

## 2015-10-05 MED ORDER — NALOXONE HCL 2 MG/2ML IJ SOSY
1.0000 ug/kg/h | PREFILLED_SYRINGE | INTRAVENOUS | Status: DC | PRN
  Filled 2015-10-05: qty 2

## 2015-10-05 MED ORDER — NALOXONE HCL 0.4 MG/ML IJ SOLN: 0.4000 mg | INTRAMUSCULAR | Status: DC | PRN

## 2015-10-05 MED ORDER — DEXTROSE IN LACTATED RINGERS 5 % IV SOLN
INTRAVENOUS | Status: DC
  Administered 2015-10-05: 21:00:00 via INTRAVENOUS

## 2015-10-05 MED ORDER — FENTANYL CITRATE (PF) 100 MCG/2ML IJ SOLN
INTRAMUSCULAR | Status: AC
  Filled 2015-10-05: qty 2

## 2015-10-05 MED ORDER — KETOROLAC TROMETHAMINE 30 MG/ML IJ SOLN: 30.0000 mg | Freq: Four times a day (QID) | INTRAMUSCULAR | Status: DC | PRN

## 2015-10-05 MED ORDER — OXYCODONE-ACETAMINOPHEN 5-325 MG PO TABS
1.0000 | ORAL_TABLET | ORAL | Status: DC | PRN
  Administered 2015-10-06 (×3): 1 via ORAL
  Filled 2015-10-05 (×3): qty 1

## 2015-10-05 MED ORDER — SIMETHICONE 80 MG PO CHEW: 80.0000 mg | CHEWABLE_TABLET | ORAL | Status: DC | PRN

## 2015-10-05 MED ORDER — SCOPOLAMINE 1 MG/3DAYS TD PT72
MEDICATED_PATCH | TRANSDERMAL | Status: AC
  Filled 2015-10-05: qty 1

## 2015-10-05 MED ORDER — ACETAMINOPHEN 325 MG PO TABS: 650.0000 mg | ORAL_TABLET | ORAL | Status: DC | PRN

## 2015-10-05 MED ORDER — NALBUPHINE HCL 10 MG/ML IJ SOLN: 5.0000 mg | Freq: Once | INTRAMUSCULAR | Status: DC | PRN

## 2015-10-05 MED ORDER — ASPIRIN 81 MG PO CHEW
81.0000 mg | CHEWABLE_TABLET | Freq: Every day | ORAL | Status: DC
  Filled 2015-10-05 (×2): qty 1

## 2015-10-05 MED ORDER — NALBUPHINE HCL 10 MG/ML IJ SOLN: 5.0000 mg | INTRAMUSCULAR | Status: DC | PRN

## 2015-10-05 MED ORDER — MEASLES, MUMPS & RUBELLA VAC ~~LOC~~ INJ: 0.5000 mL | INJECTION | Freq: Once | SUBCUTANEOUS | Status: DC

## 2015-10-05 MED ORDER — SODIUM CHLORIDE 0.9 % IR SOLN
Status: DC | PRN
  Administered 2015-10-05: 1000 mL

## 2015-10-05 MED ORDER — OXYTOCIN 40 UNITS IN LACTATED RINGERS INFUSION - SIMPLE MED: 2.5000 [IU]/h | INTRAVENOUS | Status: AC

## 2015-10-05 MED ORDER — HYDROMORPHONE HCL 1 MG/ML IJ SOLN
1.0000 mg | Freq: Once | INTRAMUSCULAR | Status: AC
  Administered 2015-10-05: 1 mg via INTRAVENOUS
  Filled 2015-10-05: qty 1

## 2015-10-05 MED ORDER — ONDANSETRON HCL 4 MG/2ML IJ SOLN
INTRAMUSCULAR | Status: DC | PRN
  Administered 2015-10-05: 4 mg via INTRAVENOUS

## 2015-10-05 MED ORDER — SCOPOLAMINE 1 MG/3DAYS TD PT72
1.0000 | MEDICATED_PATCH | Freq: Once | TRANSDERMAL | Status: DC
  Administered 2015-10-05: 1.5 mg via TRANSDERMAL

## 2015-10-05 MED ORDER — MORPHINE SULFATE (PF) 0.5 MG/ML IJ SOLN
INTRAMUSCULAR | Status: DC | PRN
  Administered 2015-10-05: .2 mg via EPIDURAL

## 2015-10-05 MED ORDER — IBUPROFEN 600 MG PO TABS
600.0000 mg | ORAL_TABLET | Freq: Four times a day (QID) | ORAL | Status: DC
  Administered 2015-10-06 – 2015-10-07 (×7): 600 mg via ORAL
  Filled 2015-10-05 (×7): qty 1

## 2015-10-05 MED ORDER — FENTANYL CITRATE (PF) 100 MCG/2ML IJ SOLN
INTRAMUSCULAR | Status: DC | PRN
  Administered 2015-10-05: 20 ug via INTRATHECAL

## 2015-10-05 MED ORDER — IBUPROFEN 600 MG PO TABS: 600.0000 mg | ORAL_TABLET | Freq: Four times a day (QID) | ORAL | Status: DC | PRN

## 2015-10-05 MED ORDER — TETANUS-DIPHTH-ACELL PERTUSSIS 5-2.5-18.5 LF-MCG/0.5 IM SUSP: 0.5000 mL | Freq: Once | INTRAMUSCULAR | Status: DC

## 2015-10-05 MED ORDER — SENNOSIDES-DOCUSATE SODIUM 8.6-50 MG PO TABS
2.0000 | ORAL_TABLET | ORAL | Status: DC
  Administered 2015-10-06 (×2): 2 via ORAL
  Filled 2015-10-05 (×2): qty 2

## 2015-10-05 MED ORDER — ONDANSETRON HCL 4 MG/2ML IJ SOLN
INTRAMUSCULAR | Status: AC
  Filled 2015-10-05: qty 2

## 2015-10-05 MED ORDER — COCONUT OIL OIL
1.0000 "application " | TOPICAL_OIL | Status: DC | PRN
  Administered 2015-10-06: 1 via TOPICAL
  Filled 2015-10-05: qty 120

## 2015-10-05 MED ORDER — OXYCODONE-ACETAMINOPHEN 5-325 MG PO TABS
2.0000 | ORAL_TABLET | ORAL | Status: DC | PRN
  Administered 2015-10-06 – 2015-10-07 (×6): 2 via ORAL
  Filled 2015-10-05 (×6): qty 2

## 2015-10-05 MED ORDER — WITCH HAZEL-GLYCERIN EX PADS: 1.0000 "application " | MEDICATED_PAD | CUTANEOUS | Status: DC | PRN

## 2015-10-05 MED ORDER — LACTATED RINGERS IV SOLN
INTRAVENOUS | Status: DC
  Administered 2015-10-05: 13:00:00 via INTRAVENOUS

## 2015-10-05 MED ORDER — DIPHENHYDRAMINE HCL 25 MG PO CAPS
25.0000 mg | ORAL_CAPSULE | ORAL | Status: DC | PRN
  Filled 2015-10-05: qty 1

## 2015-10-05 MED ORDER — MESALAMINE 1000 MG RE SUPP
1000.0000 mg | Freq: Every day | RECTAL | Status: DC
  Administered 2015-10-06 (×2): 1000 mg via RECTAL
  Filled 2015-10-05 (×3): qty 1

## 2015-10-05 MED ORDER — MENTHOL 3 MG MT LOZG
1.0000 | LOZENGE | OROMUCOSAL | Status: DC | PRN
Start: 2015-10-05 — End: 2015-10-07

## 2015-10-05 MED ORDER — PHENYLEPHRINE 8 MG IN D5W 100 ML (0.08MG/ML) PREMIX OPTIME
INJECTION | INTRAVENOUS | Status: AC
  Filled 2015-10-05: qty 100

## 2015-10-05 MED ORDER — DIBUCAINE 1 % RE OINT: 1.0000 "application " | TOPICAL_OINTMENT | RECTAL | Status: DC | PRN

## 2015-10-05 MED ORDER — SIMETHICONE 80 MG PO CHEW
80.0000 mg | CHEWABLE_TABLET | ORAL | Status: DC
  Administered 2015-10-06 (×2): 80 mg via ORAL
  Filled 2015-10-05 (×2): qty 1

## 2015-10-05 MED ORDER — SIMETHICONE 80 MG PO CHEW
80.0000 mg | CHEWABLE_TABLET | Freq: Three times a day (TID) | ORAL | Status: DC
  Administered 2015-10-06 – 2015-10-07 (×5): 80 mg via ORAL
  Filled 2015-10-05 (×5): qty 1

## 2015-10-05 MED ORDER — MEDROXYPROGESTERONE ACETATE 150 MG/ML IM SUSP: 150.0000 mg | INTRAMUSCULAR | Status: DC | PRN

## 2015-10-05 MED ORDER — MEPERIDINE HCL 25 MG/ML IJ SOLN: 6.2500 mg | INTRAMUSCULAR | Status: DC | PRN

## 2015-10-05 MED ORDER — FLUTICASONE PROPIONATE 50 MCG/ACT NA SUSP
1.0000 | Freq: Every day | NASAL | Status: DC
  Administered 2015-10-06 – 2015-10-07 (×3): 1 via NASAL
  Filled 2015-10-05: qty 16

## 2015-10-05 MED ORDER — ONDANSETRON HCL 4 MG/2ML IJ SOLN
4.0000 mg | Freq: Three times a day (TID) | INTRAMUSCULAR | Status: DC | PRN
  Administered 2015-10-05: 4 mg via INTRAVENOUS

## 2015-10-05 MED ORDER — MORPHINE SULFATE (PF) 0.5 MG/ML IJ SOLN
INTRAMUSCULAR | Status: AC
  Filled 2015-10-05: qty 10

## 2015-10-05 SURGICAL SUPPLY — 29 items
CLAMP CORD UMBIL (MISCELLANEOUS) IMPLANT
CLOTH BEACON ORANGE TIMEOUT ST (SAFETY) ×2 IMPLANT
DERMABOND ADHESIVE PROPEN (GAUZE/BANDAGES/DRESSINGS) ×1
DERMABOND ADVANCED .7 DNX6 (GAUZE/BANDAGES/DRESSINGS) ×1 IMPLANT
DRSG OPSITE POSTOP 4X10 (GAUZE/BANDAGES/DRESSINGS) ×2 IMPLANT
DURAPREP 26ML APPLICATOR (WOUND CARE) ×2 IMPLANT
ELECT REM PT RETURN 9FT ADLT (ELECTROSURGICAL) ×2
ELECTRODE REM PT RTRN 9FT ADLT (ELECTROSURGICAL) ×1 IMPLANT
EXTRACTOR VACUUM M CUP 4 TUBE (SUCTIONS) IMPLANT
GLOVE BIO SURGEON STRL SZ 6.5 (GLOVE) ×2 IMPLANT
GLOVE BIOGEL PI IND STRL 7.0 (GLOVE) ×2 IMPLANT
GLOVE BIOGEL PI INDICATOR 7.0 (GLOVE) ×2
GOWN STRL REUS W/TWL LRG LVL3 (GOWN DISPOSABLE) ×4 IMPLANT
KIT ABG SYR 3ML LUER SLIP (SYRINGE) IMPLANT
LIQUID BAND (GAUZE/BANDAGES/DRESSINGS) ×2 IMPLANT
NEEDLE HYPO 25X5/8 SAFETYGLIDE (NEEDLE) IMPLANT
NS IRRIG 1000ML POUR BTL (IV SOLUTION) ×2 IMPLANT
PACK C SECTION WH (CUSTOM PROCEDURE TRAY) ×2 IMPLANT
PAD OB MATERNITY 4.3X12.25 (PERSONAL CARE ITEMS) ×2 IMPLANT
PENCIL SMOKE EVAC W/HOLSTER (ELECTROSURGICAL) ×2 IMPLANT
SUT CHROMIC 0 CT 802H (SUTURE) IMPLANT
SUT CHROMIC 0 CTX 36 (SUTURE) ×6 IMPLANT
SUT MON AB-0 CT1 36 (SUTURE) ×2 IMPLANT
SUT PDS AB 0 CTX 60 (SUTURE) ×2 IMPLANT
SUT PLAIN 0 NONE (SUTURE) IMPLANT
SUT VIC AB 4-0 KS 27 (SUTURE) IMPLANT
SYR BULB 3OZ (MISCELLANEOUS) ×2 IMPLANT
TOWEL OR 17X24 6PK STRL BLUE (TOWEL DISPOSABLE) ×2 IMPLANT
TRAY FOLEY CATH SILVER 14FR (SET/KITS/TRAYS/PACK) IMPLANT

## 2015-10-05 NOTE — Op Note (Signed)
Cesarean Section Procedure Note   Ariel Pierce  10/05/2015  Indications: Scheduled Proceedure/Maternal Request   Pre-operative Diagnosis: repeat cesarean section.   Post-operative Diagnosis: Same   Surgeon: Surgeon(s) and Role:    * Ariel CairoGretchen Pearlie Lafosse, MD - Primary   Assistants: Mamie Leversracy Tucker, RNFA  Anesthesia: spinal   Procedure Details:  The patient was seen in the Holding Room. The risks, benefits, complications, treatment options, and expected outcomes were discussed with the patient. The patient concurred with the proposed plan, giving informed consent. identified as Ariel Pierce and the procedure verified as C-Section Delivery. A Time Out was held and the above information confirmed.  After induction of anesthesia, the patient was draped and prepped in the usual sterile manner. A transverse was made and carried down through the subcutaneous tissue to the fascia. Fascial incision was made and extended transversely. The fascia was separated from the underlying rectus tissue superiorly and inferiorly. The peritoneum was identified and entered. Peritoneal incision was extended longitudinally. The utero-vesical peritoneal reflection was incised transversely and the bladder flap was bluntly freed from the lower uterine segment. A low transverse uterine incision was made. Delivered from cephalic presentation was a vigorous female with Apgar scores of 8 at one minute and 9 at five minutes. Cord ph was not sent the umbilical cord was clamped and cut cord blood was obtained for evaluation. The placenta was removed Intact and appeared normal. The uterine outline, tubes and ovaries appeared normal}. The uterine incision was closed with running locked sutures of 0chromic gut.   Hemostasis was observed. Lavage was carried out until clear. The fascia was then reapproximated with running sutures of 0PDS. The skin was closed with 4-0Vicryl.   Instrument, sponge, and needle counts were correct prior  the abdominal closure and were correct at the conclusion of the case.     Estimated Blood Loss:850cc   Urine Output: clear  Specimens: placenta  Complications: no complications  Disposition: PACU - hemodynamically stable.   Maternal Condition: stable   Baby condition / location:  Couplet care / Skin to Skin  Attending Attestation: I was present and scrubbed for the entire procedure.   Signed: Surgeon(s): Ariel CairoGretchen Jerad Dunlap, MD

## 2015-10-05 NOTE — Progress Notes (Signed)
Called Dr Renaldo FiddlerAdkins per pt request reguarding her daily lovenox injection and wondering if she was going to get it today; no new orders given.

## 2015-10-05 NOTE — Transfer of Care (Signed)
Immediate Anesthesia Transfer of Care Note  Patient: Ariel Pierce  Procedure(s) Performed: Procedure(s) with comments: CESAREAN SECTION REPEAT (N/A) - Tracey T to RNFA - confirmed edc 10/12/15  Patient Location: PACU  Anesthesia Type:Spinal  Level of Consciousness: awake, alert , oriented and patient cooperative  Airway & Oxygen Therapy: Patient Spontanous Breathing  Post-op Assessment: Report given to RN and Post -op Vital signs reviewed and stable  Post vital signs: Reviewed and stable  Last Vitals:  Filed Vitals:   10/05/15 1158  BP: 121/82  Pulse: 79  Temp: 36.8 C  Resp: 20    Last Pain: There were no vitals filed for this visit.       Complications: No apparent anesthesia complications

## 2015-10-05 NOTE — Progress Notes (Signed)
Pitocin bolus of 100ccs given at this time

## 2015-10-05 NOTE — Anesthesia Preprocedure Evaluation (Addendum)
Anesthesia Evaluation    Airway Mallampati: III  TM Distance: >3 FB Neck ROM: Full    Dental no notable dental hx. (+) Teeth Intact   Pulmonary neg pulmonary ROS, former smoker,    Pulmonary exam normal breath sounds clear to auscultation       Cardiovascular negative cardio ROS Normal cardiovascular exam Rhythm:Regular Rate:Normal     Neuro/Psych  Headaches, Anxiety Hx/o Panic attacks Neuromuscular disease    GI/Hepatic Neg liver ROS, GERD  ,Had Diarrhea this am Hx/o Ulcerative colitis   Endo/Other  Obesity  Renal/GU negative Renal ROS  negative genitourinary   Musculoskeletal  (+) Arthritis ,   Abdominal (+) + obese,   Peds  Hematology Hx/o Thrombocytopenia  Hx/o Lupus Anticoagulant   Anesthesia Other Findings   Reproductive/Obstetrics (+) Pregnancy Previous C/Section x 1                            Anesthesia Physical Anesthesia Plan  ASA: II  Anesthesia Plan: Spinal   Post-op Pain Management:    Induction:   Airway Management Planned: Natural Airway  Additional Equipment:   Intra-op Plan:   Post-operative Plan:   Informed Consent: I have reviewed the patients History and Physical, chart, labs and discussed the procedure including the risks, benefits and alternatives for the proposed anesthesia with the patient or authorized representative who has indicated his/her understanding and acceptance.   Dental advisory given  Plan Discussed with: CRNA, Anesthesiologist and Surgeon  Anesthesia Plan Comments:         Anesthesia Quick Evaluation

## 2015-10-05 NOTE — Anesthesia Postprocedure Evaluation (Addendum)
Anesthesia Post Note  Patient: Ariel Pierce  Procedure(s) Performed: Procedure(s) (LRB): CESAREAN SECTION REPEAT (N/A)  Patient location during evaluation: PACU Anesthesia Type: Spinal Level of consciousness: awake and alert and oriented Pain management: pain level controlled Vital Signs Assessment: post-procedure vital signs reviewed and stable Respiratory status: spontaneous breathing, nonlabored ventilation and respiratory function stable Cardiovascular status: blood pressure returned to baseline and stable Postop Assessment: no signs of nausea or vomiting, no headache, no backache, patient able to bend at knees and spinal receding Anesthetic complications: no     Last Vitals:  Filed Vitals:   10/05/15 1430 10/05/15 1445  BP: 110/59 117/59  Pulse: 62 65  Temp:    Resp: 14 14    Last Pain:  Filed Vitals:   10/05/15 1505  PainSc: 4    Pain Goal:                 [name] A.

## 2015-10-05 NOTE — Anesthesia Procedure Notes (Signed)
Spinal Patient location during procedure: OR Start time: 10/05/2015 12:56 PM Staffing Anesthesiologist: Mal AmabileFOSTER, Ariel Calise Performed by: anesthesiologist  Preanesthetic Checklist Completed: patient identified, site marked, surgical consent, pre-op evaluation, timeout performed, IV checked, risks and benefits discussed and monitors and equipment checked Spinal Block Patient position: sitting Prep: site prepped and draped and DuraPrep Patient monitoring: heart rate, cardiac monitor, continuous pulse ox and blood pressure Approach: midline Location: L3-4 Injection technique: single-shot Needle Needle type: Sprotte  Needle gauge: 24 G Needle length: 9 cm Assessment Sensory level: T4 Additional Notes Patient tolerated procedure well. Adequate sensory level.

## 2015-10-05 NOTE — Lactation Note (Signed)
This note was copied from a baby's chart. Lactation Consultation Note  Patient Name: Ariel Pierce [id number]'UToday's Date: 10/05/2015 Reason for consult: Initial assessment Baby at 4 hr of life. Mom reports baby is latching well, denies breast or nipple pain. He was just coming off the breast after feeding for 15 minutes when lactation entered the room. She bf her oldest child while she was in the hospital but switched to formula when they got home because the baby was "loosing too much wt". Discussed baby behavior, feeding frequency, baby belly size, voids, wt loss, breast changes, and nipple care. Mom stated she can manually express and has a spoon in the room. Given lactation handouts. Aware of OP services and support group.     Maternal Data Has patient been taught Hand Expression?: Yes Does the patient have breastfeeding experience prior to this delivery?: Yes  Feeding Feeding Type: Breast Fed Length of feed: 15 min  LATCH Score/Interventions                      Lactation Tools Discussed/Used WIC Program: No   Consult Status Consult Status: Follow-up Date: 10/06/15 Follow-up type: In-patient    [name] 10/05/2015, 6:12 PM

## 2015-10-06 LAB — CBC
HCT: 33.4 % — ABNORMAL LOW (ref 36.0–46.0)
Hemoglobin: 11.7 g/dL — ABNORMAL LOW (ref 12.0–15.0)
MCH: 32.6 pg (ref 26.0–34.0)
MCHC: 35 g/dL (ref 30.0–36.0)
MCV: 93 fL (ref 78.0–100.0)
Platelets: 120 10*3/uL — ABNORMAL LOW (ref 150–400)
RBC: 3.59 MIL/uL — ABNORMAL LOW (ref 3.87–5.11)
RDW: 14.1 % (ref 11.5–15.5)
WBC: 12.2 10*3/uL — ABNORMAL HIGH (ref 4.0–10.5)

## 2015-10-06 MED ORDER — VALACYCLOVIR HCL 500 MG PO TABS
500.0000 mg | ORAL_TABLET | Freq: Every day | ORAL | Status: DC
  Administered 2015-10-06 – 2015-10-07 (×2): 500 mg via ORAL
  Filled 2015-10-06 (×2): qty 1

## 2015-10-06 MED ORDER — ENOXAPARIN SODIUM 40 MG/0.4ML ~~LOC~~ SOLN
40.0000 mg | SUBCUTANEOUS | Status: DC
  Administered 2015-10-06 – 2015-10-07 (×2): 40 mg via SUBCUTANEOUS
  Filled 2015-10-06 (×3): qty 0.4

## 2015-10-06 NOTE — Progress Notes (Signed)
Subjective: Postpartum Day 1: Cesarean Delivery Patient reports tolerating PO, + flatus and no problems voiding.   Patient questions staring of Lovenox, she has lupus anticoagulant disorder. Patient did not receive during pregnancy or after her last child. Lovenox was discussed with patient during pregnancy to start after delivery and during pp period Objective: Vital signs in last 24 hours: Temp:  [97.3 F (36.3 C)-99.1 F (37.3 C)] 99.1 F (37.3 C) (06/14 0300) Pulse Rate:  [18-80] 73 (06/14 0300) Resp:  [11-20] 18 (06/14 0300) BP: (103-135)/(48-82) 135/71 mmHg (06/13 2330) SpO2:  [94 %-100 %] 94 % (06/14 0300)  Physical Exam:  General: alert and cooperative Lochia: appropriate Uterine Fundus: firm Incision: healing well DVT Evaluation: No evidence of DVT seen on physical exam. Negative Homan's sign. No cords or calf tenderness. No significant calf/ankle edema.   Recent Labs  10/05/15 1138 10/06/15 0626  HGB 12.4 11.7*  HCT 36.4 33.4*    Assessment/Plan: Status post Cesarean section. Postoperative course complicated by lupus anticoagulant disorder  Continue current care. Desires circ Lovenox 40 mg sq ordered for 1 pm today  ASA discontinued. Valtrex daily for suppressive therapy  Ariel Pierce 10/06/2015, 7:40 AM

## 2015-10-06 NOTE — Lactation Note (Signed)
This note was copied from a baby's chart. Lactation Consultation Note: Baby at 7024 hours old. Mom reports that baby has breast fed a few times, but she has given formula because she wanted to make sure he was getting enough, First baby lost "a lot of weight" , Ped recommended supplementing and her milk supply never came in very well. Only pumped 1 oz at most.  Brought nipple shield with her and has tried it because her friend used one and it worked well for her. Baby had formula about 1 1/2 hours ago, is asleep in visitors arms. States she does want to keeping trying to breast feed. Encouraged to page for assist when baby wakes for next feeding.  Patient Name: Ariel Pierce [id number]'s Date: 10/06/2015 Reason for consult: Follow-up assessment   Maternal Data Formula Feeding for Exclusion: Yes Reason for exclusion: Mother's choice to formula and breast feed on admission Does the patient have breastfeeding experience prior to this delivery?: Yes  Feeding    LATCH Score/Interventions                      Lactation Tools Discussed/Used     Consult Status Consult Status: Follow-up Date: 10/06/15 Follow-up type: In-patient    Pamelia HoitWeeks, Jenna Ardoin D 10/06/2015, 2:04 PM

## 2015-10-06 NOTE — Progress Notes (Signed)
MOB was referred for history of depression/anxiety. * Referral screened out by Clinical Social Worker because none of the following criteria appear to apply: ~ History of anxiety/depression during this pregnancy, or of post-partum depression. ~ Diagnosis of anxiety and/or depression within last 3 years OR * MOB's symptoms currently being treated with medication and/or therapy. Please contact the Clinical Social Worker if needs arise, or if MOB requests.   

## 2015-10-07 MED ORDER — OXYCODONE-ACETAMINOPHEN 5-325 MG PO TABS: 1.0000 | ORAL_TABLET | ORAL | Status: DC | PRN

## 2015-10-07 MED ORDER — ENOXAPARIN SODIUM 40 MG/0.4ML ~~LOC~~ SOLN: 40.0000 mg | SUBCUTANEOUS | Status: DC

## 2015-10-07 MED ORDER — IBUPROFEN 600 MG PO TABS: 600.0000 mg | ORAL_TABLET | Freq: Four times a day (QID) | ORAL | Status: DC

## 2015-10-07 MED ORDER — ENOXAPARIN (LOVENOX) PATIENT EDUCATION KIT
PACK | Freq: Once | Status: AC
Start: 2015-10-07 — End: 2015-10-07
  Administered 2015-10-07: 12:00:00
  Filled 2015-10-07: qty 1

## 2015-10-07 NOTE — Progress Notes (Signed)
Subjective: Postpartum Day 2: Cesarean Delivery Patient reports tolerating PO, + flatus, + BM and no problems voiding.   Desires early discharge Objective: Vital signs in last 24 hours: Temp:  [97.5 F (36.4 C)-97.8 F (36.6 C)] 97.8 F (36.6 C) (06/15 16100613) Pulse Rate:  [59-69] 59 (06/15 0613) Resp:  [16-18] 18 (06/15 0613) BP: (118-166)/(64-67) 166/67 mmHg (06/15 [id number])  Physical Exam:  General: alert and cooperative Lochia: appropriate Uterine Fundus: firm Incision: healing well DVT Evaluation: No evidence of DVT seen on physical exam. Negative Homan's sign. No cords or calf tenderness. No significant calf/ankle edema.   Recent Labs  10/05/15 1138 10/06/15 0626  HGB 12.4 11.7*  HCT 36.4 33.4*    Assessment/Plan: Status post Cesarean section. Doing well postoperatively.  Discharge home with standard precautions and return to clinic in 1-2 weeks.  Zyiere Rosemond G 10/07/2015, 8:25 AM

## 2015-10-07 NOTE — Progress Notes (Signed)
Pt self-administered Lovenox into abdomen with RN providing supervision and cueing.  Instructions included rotating sites on abdomen approximately 2 inches from the umbilicus, cleaning site with alcohol prep, injecting needle at 90 degree angle to skin all the way to the hub, injecting the entire syringe contents (medication and air), removing needle straight out of the skin, and pressing down plunger to engage safety cover over needle.  Reviewed that used syringes will need to be placed in an appropriate sharps box and discarded appropriately as medical waste.  Ordered Lovenox starter kit from pharmacy and will review with patient.

## 2015-10-07 NOTE — Discharge Summary (Signed)
Obstetric Discharge Summary Reason for Admission: cesarean section Prenatal Procedures: ultrasound Intrapartum Procedures: cesarean: low cervical, transverse Postpartum Procedures: none Complications-Operative and Postpartum: none HEMOGLOBIN  Date Value Ref Range Status  10/06/2015 11.7* 12.0 - 15.0 g/dL Final  45/40/981106/09/2013 91.414.1 12.2 - 16.2 g/dL Final   HCT  Date Value Ref Range Status  10/06/2015 33.4* 36.0 - 46.0 % Final   HCT, POC  Date Value Ref Range Status  09/27/2013 44.2 37.7 - 47.9 % Final    Physical Exam:  General: alert and cooperative Lochia: appropriate Uterine Fundus: firm Incision: healing well DVT Evaluation: No evidence of DVT seen on physical exam. Negative Homan's sign. No cords or calf tenderness. No significant calf/ankle edema.  Discharge Diagnoses: Term Pregnancy-delivered  Discharge Information: Date: 10/07/2015 Activity: pelvic rest Diet: routine Medications: PNV, Ibuprofen, Percocet and Lovenox Condition: stable Instructions: refer to practice specific booklet Discharge to: home   Newborn Data: Live born female  Birth Weight: 9 lb 5.2 oz (4230 g) APGAR: 8, 9  Home with mother.  CURTIS,CAROL G 10/07/2015, 8:41 AM

## 2016-09-28 DIAGNOSIS — R760 Raised antibody titer: Secondary | ICD-10-CM | POA: Insufficient documentation

## 2016-12-14 ENCOUNTER — Encounter: Payer: Self-pay | Admitting: Obstetrics & Gynecology

## 2016-12-14 ENCOUNTER — Ambulatory Visit (INDEPENDENT_AMBULATORY_CARE_PROVIDER_SITE_OTHER): Payer: BLUE CROSS/BLUE SHIELD | Admitting: Obstetrics & Gynecology

## 2016-12-14 VITALS — BP 118/76 | HR 64 | Ht 65.0 in | Wt 146.0 lb

## 2016-12-14 DIAGNOSIS — Z01419 Encounter for gynecological examination (general) (routine) without abnormal findings: Secondary | ICD-10-CM | POA: Diagnosis not present

## 2016-12-14 MED ORDER — VALACYCLOVIR HCL 500 MG PO TABS: 500.0000 mg | ORAL_TABLET | Freq: Every day | ORAL | 12 refills | Status: DC

## 2016-12-14 MED ORDER — NONFORMULARY OR COMPOUNDED ITEM: 3 refills | Status: DC

## 2016-12-14 NOTE — Progress Notes (Signed)
36 y.o. [id number] MarriedCaucasianF here for annual exam.  Doing well.  Former patient of mine.  Has two boys.  Had 2 cesarean sections.    Using condoms for contraception.  Husband is going to have a vasectomy.    Patient's last menstrual period was 12/05/2016 (exact date).          Sexually active: Yes.    The current method of family planning is condoms all of the time.    Exercising: No.  The patient does not participate in regular exercise at present. Smoker:  no  Health Maintenance: Pap:  11/2015, normal per patient, post partum History of abnormal Pap:  Yes, LEEP 2012 MMG:  N/A Colonoscopy:  2014, Ulcerative Colitis BMD:   N/A TDaP:  2016 Pneumonia vaccine(s):  N/A Zostavax:   N/A Hep C testing: 09/28/16, negative Screening Labs: Rheumatology/Gastroenterology and PCP, Hb today: not collected, Urine today: not collected   reports that she quit smoking about 12 years ago. Her smoking use included Cigarettes. She has a 1.00 pack-year smoking history. She has never used smokeless tobacco. She reports that she drinks about 2.4 oz of alcohol per week . She reports that she does not use drugs.  Past Medical History:  Diagnosis Date  . Abnormal Pap smear   . Allergy   . AMA (advanced maternal age) multigravida 35+   . Anxiety    panic attacks - no meds currently  . Arthritis    hands hips shoulder  . Chest pain   . H/O varicella   . Headache   . Hx of varicella   . Lupus anticoagulant disorder (HCC)   . Pregnancy related carpal tunnel syndrome, antepartum 2013  . Thrombocytopenia complicating pregnancy (HCC)    2017 pregnancy  . Ulcerative colitis (HCC)    dx 2014, Dr. Loreta Ave  . Vaginal Pap smear, abnormal     Past Surgical History:  Procedure Laterality Date  . CESAREAN SECTION  11/16/2011   Procedure: CESAREAN SECTION;  Surgeon: Jeani Hawking, MD;  Location: [facility];  Service: Gynecology;  Laterality: N/A;  . CESAREAN SECTION N/A 10/05/2015   Procedure: CESAREAN SECTION  REPEAT;  Surgeon: Zelphia Cairo, MD;  Location: [facility]TH[facility] SU[facility];  Service: Obstetrics;  Laterality: N/A;  [facility] T to RNFA - confirmed edc 10/12/15  . LEEP    . WISDOM TOOTH EXTRACTION      Current Outpatient Prescriptions  Medication Sig Dispense Refill  . buPROPion (WELLBUTRIN XL) 150 MG 24 hr tablet Take 1 tablet by mouth daily.    Melene Muller ON 12/18/2016] ENBREL SURECLICK 50 MG/ML injection Inject 50 mg into the skin once a week.    Marland Kitchen FLUoxetine (PROZAC) 20 MG capsule Take 1 capsule by mouth daily.    . fluticasone (FLONASE) 50 MCG/ACT nasal spray Place 1 spray into both nostrils daily.    Marland Kitchen HUMIRA PEN 40 MG/0.8ML PNKT Inject 40 mg into the skin every 14 (fourteen) days.    Marland Kitchen LORazepam (ATIVAN) 0.5 MG tablet Take 1 tablet by mouth as needed.    . mesalamine (CANASA) 1000 MG suppository Place 1,000 mg rectally at bedtime.    . predniSONE (DELTASONE) 5 MG tablet as directed.    . valACYclovir (VALTREX) 500 MG tablet Take 500 mg by mouth daily.     No current facility-administered medications for this visit.     Family History  Problem Relation Age of Onset  . Heart disease Father   . Arthritis Father   . Hypertension  Maternal Grandmother   . Thrombophlebitis Maternal Grandmother        has had "blood clots"  . Thyroid disease Maternal Grandmother   . Kidney disease Maternal Grandmother   . Colitis Brother   . Diabetes Cousin     ROS:  Pertinent items are noted in HPI.  Otherwise, a comprehensive ROS was negative.  Exam:   BP 118/76 (BP Location: Right Arm, Patient Position: Sitting, Cuff Size: Normal)   Pulse 64   Ht 5\' 5"  (1.651 m)   Wt 146 lb (66.2 kg)   LMP 12/05/2016 (Exact Date)   Breastfeeding? No   BMI 24.30 kg/m     Height: 5\' 5"  (165.1 cm)  Ht Readings from Last 3 Encounters:  12/14/16 5\' 5"  (1.651 m)  09/21/15 5\' 5"  (1.651 m)  09/27/13 5\' 6"  (1.676 m)    General appearance: alert, cooperative and appears stated age Head: Normocephalic, without obvious  abnormality, atraumatic Neck: no adenopathy, supple, symmetrical, trachea midline and thyroid normal to inspection and palpation Lungs: clear to auscultation bilaterally Breasts: normal appearance, no masses or tenderness Heart: regular rate and rhythm Abdomen: soft, non-tender; bowel sounds normal; no masses,  no organomegaly Extremities: extremities normal, atraumatic, no cyanosis or edema Skin: Skin color, texture, turgor normal. No rashes or lesions Lymph nodes: Cervical, supraclavicular, and axillary nodes normal. No abnormal inguinal nodes palpated Neurologic: Grossly normal   Pelvic: External genitalia:  no lesions              Urethra:  normal appearing urethra with no masses, tenderness or lesions              Bartholins and Skenes: normal                 Vagina: normal appearing vagina with normal color and discharge, no lesions              Cervix: no lesions              Pap taken: Yes.  (requesting records for last two pap smears.  Will see if order is actually needed.) Bimanual Exam:  Uterus:  normal size, contour, position, consistency, mobility, non-tender              Adnexa: normal adnexa and no mass, fullness, tenderness               Rectovaginal: Confirms               Anus:  normal sphincter tone, no lesions  Chaperone was present for exam.  A:  Well Woman with normal exam Psoriatic arthritis, Dr. Allena Katz Mayur Ulcerative colitis, followed by Dr. 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  Release of records for last two pap smears. Valtrex 500mg  daily.  #30/12RF Trial of Vit E vaginal suppositories.  Rx for 200U/ML.  One pv three times weekly.  #36/3RF return annually or prn

## 2016-12-14 NOTE — Patient Instructions (Signed)
For the valtrex, with symptoms, use twice daily for 3 days.

## 2016-12-26 ENCOUNTER — Other Ambulatory Visit (HOSPITAL_COMMUNITY)
Admission: RE | Admit: 2016-12-26 | Discharge: 2016-12-26 | Disposition: A | Discharge status: Home / Self Care | Payer: BLUE CROSS/BLUE SHIELD | Source: Ambulatory Visit | Attending: Obstetrics & Gynecology | Admitting: Obstetrics & Gynecology

## 2016-12-26 ENCOUNTER — Other Ambulatory Visit: Payer: Self-pay | Admitting: Obstetrics & Gynecology

## 2016-12-26 DIAGNOSIS — Z124 Encounter for screening for malignant neoplasm of cervix: Secondary | ICD-10-CM | POA: Diagnosis not present

## 2016-12-29 LAB — CYTOLOGY - PAP
Adequacy: ABSENT
Diagnosis: NEGATIVE
HPV (WINDOPATH): NOT DETECTED

## 2017-08-28 ENCOUNTER — Telehealth: Payer: Self-pay | Admitting: Obstetrics & Gynecology

## 2017-08-28 NOTE — Telephone Encounter (Signed)
Patient called to report she is experiencing bleeding in between cycles that has happened after exercising.  Last seen: 12/14/16

## 2017-08-28 NOTE — Telephone Encounter (Signed)
Spoke with patient. Reports BTB between cycles for the last couple of months. Describes as spotting. Worked out last night for the first time and noticed BTB again. Reports mild cramping, has hx of ulcerative colitis, is unable to determine if the cramping is related to UC or menses.  LMP 08/19/17. Spouse vasectomy for contraceptive. Denies N/V, fever/chills, urinary complaints.   Recommended OV for further evaluation. OV scheduled for 08/30/17 at 1:45pm with Dr. Hyacinth Meeker. Patient declined OV for today.   Routing to provider for final review. Patient is agreeable to disposition. Will close encounter.

## 2017-08-28 NOTE — Telephone Encounter (Signed)
Left message to call Ferlando Lia at [phone].  

## 2017-08-30 ENCOUNTER — Ambulatory Visit (INDEPENDENT_AMBULATORY_CARE_PROVIDER_SITE_OTHER): Payer: BLUE CROSS/BLUE SHIELD | Admitting: Obstetrics & Gynecology

## 2017-08-30 ENCOUNTER — Encounter: Payer: Self-pay | Admitting: Obstetrics & Gynecology

## 2017-08-30 ENCOUNTER — Other Ambulatory Visit: Payer: Self-pay

## 2017-08-30 VITALS — BP 112/60 | HR 92 | Resp 16 | Ht 65.0 in | Wt 148.0 lb

## 2017-08-30 DIAGNOSIS — N926 Irregular menstruation, unspecified: Secondary | ICD-10-CM

## 2017-08-30 LAB — POCT URINE PREGNANCY: Preg Test, Ur: NEGATIVE

## 2017-08-30 NOTE — Progress Notes (Signed)
Scheduled SHGM while in office for 09/20/2017 at 3:30 pm with 4 pm consult with Dr.Miller. Patient is agreeable to date and time.

## 2017-08-30 NOTE — Progress Notes (Signed)
GYNECOLOGY  VISIT  CC:   Irregular bleeding  HPI: 37 y.o. [id number] Married Caucasian female here for irregular bleeding on and off since January.  Feels cycles have changed early in the year.  In January or Feb, she had an 11 day cycle that just seems to drag on.  Cycles are typically 7 (but were 5) and flow is gradually getting heavier.  Spotting is light.  She's had spotting after working out as well just randomly.  Has experienced some very light spotting immediately after intercourse.  She has been diagnosed with a polyp in the past and wonders if that is the cause of her bleeding irregularities.    Did have some hx of dyspareunia but this really improved after he had a vasectomy and they stopped using condoms.  She is allergic to Latex but never really connected the latex allergy and issues with condoms.    Husband had a vasectomy for birth control.    Last Pap neg with neg HR HPV 9/18.  GYNECOLOGIC HISTORY: LMP:  08/18/17 Contraception: vasectomy  Menopausal hormone therapy: none  Patient Active Problem List   Diagnosis Date Noted  . Lupus anticoagulant positive 09/28/2016  . S/P cesarean section 10/05/2015  . Viral meningitis 01/07/2015  . Psoriatic arthritis (HCC) 01/07/2013  . Psoriasis 12/26/2012  . Ulcerative colitis (HCC) 12/26/2012    Past Medical History:  Diagnosis Date  . Abnormal Pap smear   . Allergy   . AMA (advanced maternal age) multigravida 35+   . Anxiety    panic attacks - no meds currently  . Arthritis    hands hips shoulder  . Chest pain   . H/O varicella   . Headache   . Hx of varicella   . Lupus anticoagulant disorder (HCC)   . Pregnancy related carpal tunnel syndrome, antepartum 2013  . Thrombocytopenia complicating pregnancy (HCC)    2017 pregnancy  . Ulcerative colitis (HCC)    dx 2014, Dr. Loreta Ave  . Vaginal Pap smear, abnormal     Past Surgical History:  Procedure Laterality Date  . CESAREAN SECTION  11/16/2011   Procedure: CESAREAN  SECTION;  Surgeon: Jeani Hawking, MD;  Location: [facility];  Service: Gynecology;  Laterality: N/A;  . CESAREAN SECTION N/A 10/05/2015   Procedure: CESAREAN SECTION REPEAT;  Surgeon: Zelphia Cairo, MD;  Location: [facility] BIRTHING SUIT[facility];  Service: Obstetrics;  Laterality: N/A;  [facility] T to RNFA - confirmed edc 10/12/15  . LEEP    . WISDOM TOOTH EXTRACTION      MEDS:   Current Outpatient Medications on File Prior to Visit  Medication Sig Dispense Refill  . buPROPion (WELLBUTRIN XL) 150 MG 24 hr tablet Take 1 tablet by mouth daily.    Elgie Collard SURECLICK 50 MG/ML injection Inject 50 mg into the skin once a week.    Marland Kitchen FLUoxetine (PROZAC) 10 MG capsule Take 1 capsule by mouth daily.  2  . fluticasone (FLONASE) 50 MCG/ACT nasal spray Place 1 spray into both nostrils daily.    Marland Kitchen loratadine (CLARITIN) 10 MG tablet Take by mouth daily.    Marland Kitchen LORazepam (ATIVAN) 0.5 MG tablet Take 1 tablet by mouth as needed.    . mesalamine (CANASA) 1000 MG suppository Place 1,000 mg rectally at bedtime.    . TURMERIC PO Take by mouth daily.    . methotrexate (RHEUMATREX) 2.5 MG tablet Take by mouth as directed.     No current facility-administered medications on file prior to visit.  ALLERGIES: Zyrtec [cetirizine hcl] and Latex  Family History  Problem Relation Age of Onset  . Heart disease Father   . Arthritis Father   . Hypertension Maternal Grandmother   . Thrombophlebitis Maternal Grandmother        has had "blood clots"  . Thyroid disease Maternal Grandmother   . Kidney disease Maternal Grandmother   . Colitis Brother   . Diabetes Cousin     SH:  Married, non smoker  Review of Systems  Genitourinary:       Excess bleeding Painful periods Unscheduled bleeding Pain and bleeding with intercourse   All other systems reviewed and are negative.   PHYSICAL EXAMINATION:    BP 112/60 (BP Location: Right Arm, Patient Position: Sitting, Cuff Size: Normal)   Pulse 92   Resp 16   Ht  (1.651  m)   Wt 148 lb (67.1 kg)   LMP 08/27/2017   BMI 24.63 kg/m     General appearance: alert, cooperative and appears stated age Neck: no adenopathy, supple, symmetrical, trachea midline and thyroid normal to inspection and palpation CV:  Regular rate and rhythm Lungs:  clear to auscultation, no wheezes, rales or rhonchi, symmetric air entry Abdomen: soft, non-tender; bowel sounds normal; no masses,  no organomegaly  Pelvic: External genitalia:  no lesions              Urethra:  normal appearing urethra with no masses, tenderness or lesions              Bartholins and Skenes: normal                 Vagina: normal appearing vagina with normal color and discharge, no lesions              Cervix: no lesions              Bimanual Exam:  Uterus:  normal size, contour, position, consistency, mobility, non-tender              Adnexa: no mass, fullness, tenderness  Chaperone was present for exam.  Assessment: Irregular bleeding H/O endometrial polyp S/p cesarean section x 2, 2013 and 2017  Plan: FSH and estradiol will be obtained today. Have recommended proceeding with SHGM.  Would recommend doing this right after next menstrual cycle to make sure the endometrium is thin for best evaluation.  She is going to call with onset of cycle to schedule.   ~20 minutes spent with patient >50% of time was in face to face discussion of above.

## 2017-08-31 LAB — ESTRADIOL: ESTRADIOL: 44.3 pg/mL

## 2017-08-31 LAB — FOLLICLE STIMULATING HORMONE: FSH: 7.2 m[IU]/mL

## 2017-09-02 ENCOUNTER — Encounter: Payer: Self-pay | Admitting: Obstetrics & Gynecology

## 2017-09-03 ENCOUNTER — Telehealth: Payer: Self-pay | Admitting: *Deleted

## 2017-09-03 NOTE — Telephone Encounter (Signed)
Notes recorded by Loreta Ave, CMA on 09/03/2017 at 2:09 PM EDT LM for pt with normal results. Per pt release form. ------  Notes recorded by Jerene Bears, MD on 09/02/2017 at 11:30 PM EDT Please let pt know her [facility] and estradiol were normal. She needs to call with onset of menstrual cycle to schedule SHGM.

## 2017-09-04 ENCOUNTER — Telehealth: Payer: Self-pay | Admitting: Obstetrics & Gynecology

## 2017-09-04 NOTE — Telephone Encounter (Signed)
Call placed to patient to review benefits for scheduled sonohysterogram. Left voicemail message requesting a return call. °

## 2017-09-10 NOTE — Telephone Encounter (Signed)
Spoke with patient regarding benefit for a sonohysterogram. Patient understood and agreeable. Patient was previously scheduled for the appointment on 09/20/17 with Dr Hyacinth Meeker. Patient advises her last monthly period was on 08/19/17 and states she should begin her cycle around 09/16/17. Review cycle information and appointment time frame with Triage Nurse, Nolen Mu, RN. Based on the date patient is estimated to begin her cycle, appointment on 09/20/17 has been canceled. Patient has been advised to call the day she starts her cycle for scheduling. Patient acknowledges understanding and will call when she begins her cycle.  Routing to Dr Hyacinth Meeker for review  cc: Nolen Mu, RN

## 2017-09-14 ENCOUNTER — Telehealth: Payer: Self-pay | Admitting: Obstetrics & Gynecology

## 2017-09-14 NOTE — Telephone Encounter (Signed)
Spoke with patient. LMP 09/13/17, calling to schedule SHGM. No contraceptive.   Advised I need to review schedule with Dr. Hyacinth Meeker and return call, patient agreeable.

## 2017-09-14 NOTE — Telephone Encounter (Signed)
Patient is ready to schedule her ultrasound.  °

## 2017-09-14 NOTE — Telephone Encounter (Signed)
Left message to call Brittnae Aschenbrenner at [phone].  

## 2017-09-14 NOTE — Telephone Encounter (Signed)
Reviewed with Dr. Hyacinth Meeker, call returned to patient. SHGM scheduled for 09/18/17 at 4:30pm. Advised to take Motrin 800 mg with food and water one hour before procedure. Patient verbalizes understanding.   Routing to provider for final review. Patient is agreeable to disposition. Will close encounter.  Cc: Harland Dingwall, Soundra Pilon

## 2017-09-18 ENCOUNTER — Other Ambulatory Visit: Payer: Self-pay | Admitting: Obstetrics & Gynecology

## 2017-09-18 ENCOUNTER — Ambulatory Visit (INDEPENDENT_AMBULATORY_CARE_PROVIDER_SITE_OTHER): Payer: BLUE CROSS/BLUE SHIELD | Admitting: Obstetrics & Gynecology

## 2017-09-18 ENCOUNTER — Ambulatory Visit (INDEPENDENT_AMBULATORY_CARE_PROVIDER_SITE_OTHER): Payer: BLUE CROSS/BLUE SHIELD

## 2017-09-18 DIAGNOSIS — N926 Irregular menstruation, unspecified: Secondary | ICD-10-CM

## 2017-09-18 MED ORDER — NORETHINDRONE 0.35 MG PO TABS: 1.0000 | ORAL_TABLET | Freq: Every day | ORAL | 3 refills | Status: DC

## 2017-09-18 NOTE — Progress Notes (Signed)
37 y.o. Ariel Pierce here for a pelvic ultrasound with sonohystogram due to irregular bleeding that has been on and off since January.  Cycles have been irregular with an 11 day cycle in January.  Flow has gottne heavier as well as having spotting after working out and immediately after intercourse.  Pt had normal pap smear with neg HR HPV 9/18.  She does have a hx of polyps.      Patient's last menstrual period was 08/27/2017.  Contraception:  vasectomy  Technique:  Both transabdominal and transvaginal ultrasound examinations of the pelvis were performed. Transabdominal technique was performed for global imaging of the pelvis including uterus, ovaries, adnexal regions, and pelvic cul-de-sac.  It was necessary to proceed with endovaginal exam following the abdominal ultrasound transabdominal exam to visualize the endometrium and adnexa.  Color and duplex Doppler ultrasound was utilized to evaluate blood flow to the ovaries.    FINDINGS: Uterus: 10.2 x 5.3 x 4.3cm  Endometrium: 6.6 - 11mm Adnexa:  Left: 3.6 x 2.1 x 1.6cm     Right: 4.0 x 2.4 x 1.8cm     Cul de sac: no free fluid  SHSG:  After obtaining appropriate verbal consent from patient, the cervix was visualized using a speculum, and prepped with betadine.  A tenaculum  was applied to the cervix.  Dilation of the cervix was not necessary. The catheter was passed into the uterus and sterile saline introduced, with the following findings: no intracavitary lesions noted.  Discussion:  Findings and pictures reviewed with pt.  Do not feel she needs an endometrial biopsy at this point.  Options for treatment discussed including POPs or IUD.  She and I have discussed not using estrogen containing methods if possible.  Pt wonders if other medications could be contributing.    She also has some questions about medications that she started after delivery of second child.  She would like to consider have to decrease these or even possibly  stopping.  D/w pt how to do this very slowly.    She is not sure she wants to start any hormonal medication for bleeding and may just wait a few more months before making any decisions.  I feel comfortable with this.  Assessment: Irregular bleeding, normal SHGM H/O cesarean section x 2 Anxiety/depression post partum  Plan:   Pt is going to likely wait a few more months before starting any treatment for her irregular bleeding.  Rx for Micornor was sent to pharmacy if pt changes mind.  She is going to let me know if she starts on medication for follow up in 3 months after starting micronor.  ~30 minutes spent with patient >50% of time was in face to face discussion of above.

## 2017-09-18 NOTE — Progress Notes (Deleted)
Patient ID: Ariel Pierce female   DOB: Aug 11, 1980, 37 y.o.   MRN: [id number]

## 2017-09-20 ENCOUNTER — Other Ambulatory Visit: Payer: BLUE CROSS/BLUE SHIELD | Admitting: Obstetrics & Gynecology

## 2017-09-20 ENCOUNTER — Other Ambulatory Visit: Payer: BLUE CROSS/BLUE SHIELD

## 2017-09-21 ENCOUNTER — Encounter: Payer: Self-pay | Admitting: Obstetrics & Gynecology

## 2017-12-27 ENCOUNTER — Ambulatory Visit: Payer: BLUE CROSS/BLUE SHIELD | Admitting: Obstetrics & Gynecology

## 2018-01-08 ENCOUNTER — Other Ambulatory Visit: Payer: Self-pay | Admitting: Gastroenterology

## 2018-01-08 DIAGNOSIS — R1033 Periumbilical pain: Secondary | ICD-10-CM

## 2018-01-09 ENCOUNTER — Encounter (HOSPITAL_COMMUNITY): Payer: Self-pay

## 2018-01-09 ENCOUNTER — Ambulatory Visit (HOSPITAL_COMMUNITY)
Admission: RE | Admit: 2018-01-09 | Discharge: 2018-01-09 | Disposition: A | Discharge status: Home / Self Care | Payer: BLUE CROSS/BLUE SHIELD | Source: Ambulatory Visit | Attending: Gastroenterology | Admitting: Gastroenterology

## 2018-01-09 DIAGNOSIS — R1033 Periumbilical pain: Secondary | ICD-10-CM | POA: Diagnosis not present

## 2018-01-09 DIAGNOSIS — N200 Calculus of kidney: Secondary | ICD-10-CM | POA: Insufficient documentation

## 2018-01-09 MED ORDER — IOHEXOL 300 MG/ML  SOLN
100.0000 mL | Freq: Once | INTRAMUSCULAR | Status: AC | PRN
  Administered 2018-01-09: 100 mL via INTRAVENOUS

## 2018-02-05 ENCOUNTER — Other Ambulatory Visit: Payer: Self-pay | Admitting: Obstetrics & Gynecology

## 2018-02-06 DIAGNOSIS — F411 Generalized anxiety disorder: Secondary | ICD-10-CM | POA: Insufficient documentation

## 2018-02-12 ENCOUNTER — Other Ambulatory Visit (HOSPITAL_COMMUNITY)
Admission: RE | Admit: 2018-02-12 | Discharge: 2018-02-12 | Disposition: A | Discharge status: Home / Self Care | Payer: BLUE CROSS/BLUE SHIELD | Source: Ambulatory Visit | Attending: Obstetrics & Gynecology | Admitting: Obstetrics & Gynecology

## 2018-02-12 ENCOUNTER — Other Ambulatory Visit: Payer: Self-pay | Admitting: Obstetrics & Gynecology

## 2018-02-12 ENCOUNTER — Encounter: Payer: Self-pay | Admitting: Obstetrics & Gynecology

## 2018-02-12 ENCOUNTER — Encounter

## 2018-02-12 ENCOUNTER — Other Ambulatory Visit: Payer: Self-pay

## 2018-02-12 ENCOUNTER — Ambulatory Visit: Payer: BLUE CROSS/BLUE SHIELD | Admitting: Obstetrics & Gynecology

## 2018-02-12 ENCOUNTER — Ambulatory Visit (INDEPENDENT_AMBULATORY_CARE_PROVIDER_SITE_OTHER): Payer: BLUE CROSS/BLUE SHIELD | Admitting: Obstetrics & Gynecology

## 2018-02-12 VITALS — BP 114/60 | HR 96 | Resp 16 | Ht 65.0 in | Wt 140.8 lb

## 2018-02-12 DIAGNOSIS — Z01419 Encounter for gynecological examination (general) (routine) without abnormal findings: Secondary | ICD-10-CM | POA: Diagnosis not present

## 2018-02-12 DIAGNOSIS — N898 Other specified noninflammatory disorders of vagina: Secondary | ICD-10-CM | POA: Diagnosis not present

## 2018-02-12 DIAGNOSIS — Z124 Encounter for screening for malignant neoplasm of cervix: Secondary | ICD-10-CM

## 2018-02-12 MED ORDER — VALACYCLOVIR HCL 500 MG PO TABS: 500.0000 mg | ORAL_TABLET | Freq: Every day | ORAL | 4 refills | Status: DC

## 2018-02-12 NOTE — Progress Notes (Signed)
37 y.o. [id number] Married White or Caucasian female here for annual exam.  Cycles are more regular but a little early so cycles are more like 24 to 25 days.  With her most recent cycle, she had some greenish discharge.  She's currently on antibiotics and she is having external and perirectal itching.  Never took the micronor.  Also reports she's been having bilateral nipple itching for several months.  Not sure if related to the Enbrel.  Having a lot of nail issues due to the medication.  Has used clobetasol and this has not helped.  Has also used Lanolin and this didn't help.  Not exactly sure how long this has been going on but several months.  Has considered seeing dermatology.  Has an appt but with a female provider.  Had periumbilical pain in mid September.  Called Dr. Loreta Ave and was seen that day.  CT of abdomen and pelvis was obtained.  Reviewed with pt today.  Pt reports Dr. Loreta Ave did blood work that was all normal.  She wishes she knew what was the cause.  Has made her worry some.    Lastly, is having some vaginal, vulvar itching on the left side especially.  Not significant discharge noted.    Patient's last menstrual period was 01/18/2018 (approximate).          Sexually active: Yes.    The current method of family planning is vasectomy.    Exercising: Yes.    weights, cardio 3-5 x weekly  Smoker:  no  Health Maintenance: Pap:  12/26/16 Neg. HR HPV:neg  11/15/15 Neg  History of abnormal Pap:  Yes, LEEP 2012 MMG:  Never Colonoscopy:  2014 Ulcerative Colitis  BMD:   Never TDaP:  2017 Hep C testing: 09/28/16 Neg - Care everywhere  Screening Labs: PCP   reports that she quit smoking about 13 years ago. Her smoking use included cigarettes. She has a 1.00 pack-year smoking history. She has never used smokeless tobacco. She reports that she drinks about 1.0 standard drinks of alcohol per week. She reports that she does not use drugs.  Past Medical History:  Diagnosis Date  . Abnormal Pap smear    . Allergy   . Anxiety    panic attacks - no meds currently  . Arthritis    hands hips shoulder  . Chest pain   . Headache   . HSV infection   . Lupus anticoagulant disorder (HCC)   . Pregnancy related carpal tunnel syndrome, antepartum 2013  . Thrombocytopenia complicating pregnancy (HCC)    2017 pregnancy  . Ulcerative colitis (HCC)    dx 2014, Dr. Loreta Ave    Past Surgical History:  Procedure Laterality Date  . CESAREAN SECTION  11/16/2011   Procedure: CESAREAN SECTION;  Surgeon: Jeani Hawking, MD;  Location: [facility];  Service: Gynecology;  Laterality: N/A;  . CESAREAN SECTION N/A 10/05/2015   Procedure: CESAREAN SECTION REPEAT;  Surgeon: Zelphia Cairo, MD;  Location: [facility];  Service: Obstetrics;  Laterality: N/A;  [facility] T to [facility] - confirmed edc 10/12/15  . LEEP    . WISDOM TOOTH EXTRACTION      Current Outpatient Medications  Medication Sig Dispense Refill  . buPROPion (WELLBUTRIN XL) 150 MG 24 hr tablet Take 1 tablet by mouth daily.    . Clobetasol Prop Emollient Base 0.05 % emollient cream Apply topically 2 (two) times daily as needed.    . doxycycline (ADOXA) 100 MG tablet Take 1 tablet by mouth  2 (two) times daily.    Elgie Collard SURECLICK 50 MG/ML injection Inject 50 mg into the skin once a week.    Marland Kitchen FLUoxetine (PROZAC) 10 MG capsule Take 1 capsule by mouth daily.  2  . fluticasone (FLONASE) 50 MCG/ACT nasal spray Place 1 spray into both nostrils daily.    Marland Kitchen loratadine (CLARITIN) 10 MG tablet Take by mouth daily.    Marland Kitchen LORazepam (ATIVAN) 0.5 MG tablet Take 1 tablet by mouth as needed.    . mesalamine (CANASA) 1000 MG suppository Place 1,000 mg rectally at bedtime.    Marland Kitchen MOTEGRITY 2 MG TABS Take 1 tablet by mouth daily.    . TURMERIC PO Take by mouth daily.    . valACYclovir (VALTREX) 500 MG tablet TK 1 T PO D  12  . norethindrone (MICRONOR,CAMILA,ERRIN) 0.35 MG tablet Take 1 tablet (0.35 mg total) by mouth daily. (Patient not taking: Reported on 02/12/2018)  1 Package 3   No current facility-administered medications for this visit.     Family History  Problem Relation Age of Onset  . Heart disease Father   . Arthritis Father   . Hypertension Maternal Grandmother   . Thrombophlebitis Maternal Grandmother        has had "blood clots"  . Thyroid disease Maternal Grandmother   . Kidney disease Maternal Grandmother   . Colitis Brother   . Diabetes Cousin     Review of Systems  Genitourinary:       Breast pain   All other systems reviewed and are negative.   Exam:   BP 114/60 (BP Location: Right Arm, Patient Position: Sitting, Cuff Size: Normal)   Pulse 96   Resp 16   Ht 5\' 5"  (1.651 m)   Wt 140 lb 12.8 oz (63.9 kg)   LMP 01/18/2018 (Approximate)   BMI 23.43 kg/m   Height: 5\' 5"  (165.1 cm)  Ht Readings from Last 3 Encounters:  02/12/18 5\' 5"  (1.651 m)  09/18/17 5\' 5"  (1.651 m)  08/30/17 5\' 5"  (1.651 m)    General appearance: alert, cooperative and appears stated age Head: Normocephalic, without obvious abnormality, atraumatic Neck: no adenopathy, supple, symmetrical, trachea midline and thyroid normal to inspection and palpation Lungs: clear to auscultation bilaterally Breasts: normal appearance, no masses or tenderness Heart: regular rate and rhythm Abdomen: soft, non-tender; bowel sounds normal; no masses,  no organomegaly Extremities: extremities normal, atraumatic, no cyanosis or edema Skin: Skin color, texture, turgor normal. No rashes or lesions Lymph nodes: Cervical, supraclavicular, and axillary nodes normal. No abnormal inguinal nodes palpated Neurologic: Grossly normal   Pelvic: External genitalia:  no lesions              Urethra:  normal appearing urethra with no masses, tenderness or lesions              Bartholins and Skenes: normal                 Vagina: normal appearing vagina with normal color and discharge, no lesions              Cervix: no lesions              Pap taken: Yes.   Bimanual Exam:   Uterus:  normal size, contour, position, consistency, mobility, non-tender              Adnexa: normal adnexa and no mass, fullness, tenderness  Rectovaginal: Confirms               Anus:  normal sphincter tone, no lesions  Chaperone was present for exam.  A:  Well Woman with normal exam Nipple itching H/O cesarean section x 2 Anxiety/depression post partum H/O HSV  P:   Mammogram guidelines reviewed.  She is going to try OTC lamisil or lotrimin cream on her nipples and then give me an update after about a week.  If no change, will plan MMG.  Does not look like Paget's disease of the breast but this can have varying ways of presentation.  Is going to keep dermatology appt as well.  pap smear obtained today Affirm obtained today.  If negative, she will treat with Clobetasol 0.05% bid x 10-14 days.  If this does not resolve itching, she will return for a vulvar biopsy. Pap obtained today.  HR HPV not needed. Rx for Valtrex 500mg  daily.  #90/4RF. return annually or prn

## 2018-02-13 LAB — VAGINITIS/VAGINOSIS, DNA PROBE
Candida Species: NEGATIVE
GARDNERELLA VAGINALIS: NEGATIVE
TRICHOMONAS VAG: NEGATIVE

## 2018-02-13 LAB — CYTOLOGY - PAP: Diagnosis: NEGATIVE

## 2018-02-14 ENCOUNTER — Telehealth: Payer: Self-pay | Admitting: *Deleted

## 2018-02-14 NOTE — Telephone Encounter (Signed)
-----   Message from Jerene Bears, MD sent at 02/13/2018  2:16 PM EDT ----- Please let pt know this testing was negative for yeast or BV.  She should use the steroid ointment she has (please confirm that it is an ointment) and she should use it BID x 10-14 days.  If does not resolve itching, she needs to return for a skin biopsy.  We discussed all of this on 10/22 at her appt.  Thanks.

## 2018-02-14 NOTE — Telephone Encounter (Signed)
Patient returned call

## 2018-02-14 NOTE — Telephone Encounter (Signed)
Left voice mail to call back 

## 2018-02-15 MED ORDER — CLOBETASOL PROPIONATE 0.05 % EX OINT: 1.0000 "application " | TOPICAL_OINTMENT | Freq: Two times a day (BID) | CUTANEOUS | 0 refills | Status: DC

## 2018-02-15 NOTE — Telephone Encounter (Signed)
Clobetasol ointment RX to pharmacy, patient notified. Encounter closed.

## 2018-02-15 NOTE — Telephone Encounter (Signed)
Spoke with patient, advised as seen below per Dr. Hyacinth Meeker. Patient states she has clobetasol cream. Advised I will review with Dr. Hyacinth Meeker and return call if new RX is needed. Confirmed pharmacy on file. Patient request detailed message on mobile number if no answer.   Dr. Hyacinth Meeker -please advise on RX

## 2018-02-15 NOTE — Telephone Encounter (Signed)
Left voicemail to call back. Route to TRIAGE if I am not available please

## 2018-02-15 NOTE — Telephone Encounter (Signed)
Clobetasol ointment 0.05% bid x 10-14 days.  If she has ointment, no new rx needed.  Thanks.

## 2018-10-22 ENCOUNTER — Encounter: Payer: Self-pay | Admitting: Obstetrics & Gynecology

## 2018-10-22 ENCOUNTER — Other Ambulatory Visit: Payer: Self-pay | Admitting: Obstetrics & Gynecology

## 2018-10-22 MED ORDER — VALACYCLOVIR HCL 500 MG PO TABS: 500.0000 mg | ORAL_TABLET | Freq: Every day | ORAL | 1 refills | Status: DC

## 2018-10-22 NOTE — Telephone Encounter (Signed)
Patient sent the following correspondence through [place]. Routing to triage to assist patient with request.  Good morning -     My prescription insurance carrier, [facility], requires 90-day refills for maintenance medications which are not currently available through the [facility] that I use. If you don't mind, may I ask that you call in or submit a refill to [facility] for the Valacyclovir, [facility]-days[facility] [address]?     Thanks!     Ariel Pierce  [phone]

## 2018-10-22 NOTE — Telephone Encounter (Signed)
Medication refill request: Valtrex Last AEX:  02-12-18 SM  Next AEX: 05-23-19 Last MMG (if hormonal medication request): n/a Refill authorized: Today, please advise.   Medication pended for #90, 0RF. Please refill if appropriate.

## 2019-03-30 ENCOUNTER — Ambulatory Visit (HOSPITAL_COMMUNITY)
Admission: EM | Admit: 2019-03-30 | Discharge: 2019-03-30 | Disposition: A | Discharge status: Home / Self Care | Payer: BC Managed Care – PPO | Attending: Family Medicine | Admitting: Family Medicine

## 2019-03-30 ENCOUNTER — Encounter (HOSPITAL_COMMUNITY): Payer: Self-pay

## 2019-03-30 ENCOUNTER — Other Ambulatory Visit: Payer: Self-pay

## 2019-03-30 DIAGNOSIS — J3489 Other specified disorders of nose and nasal sinuses: Secondary | ICD-10-CM

## 2019-03-30 DIAGNOSIS — Z20822 Contact with and (suspected) exposure to covid-19: Secondary | ICD-10-CM

## 2019-03-30 DIAGNOSIS — Z20828 Contact with and (suspected) exposure to other viral communicable diseases: Secondary | ICD-10-CM | POA: Diagnosis not present

## 2019-03-30 DIAGNOSIS — R0981 Nasal congestion: Secondary | ICD-10-CM | POA: Diagnosis not present

## 2019-03-30 NOTE — ED Provider Notes (Signed)
[facility]    CSN: [id number] Arrival date & time: 03/30/19  1553      History   Chief Complaint Chief Complaint  Patient presents with  . Sore Throat    HPI Ariel Pierce is a 38 y.o. female.   HPI  Has been exposed to coronavirus.  A niece was coming in the house several days a week to help due to her 8-year-old son.  The niece had a cold.  She has now been found to have coronavirus. Patient worries because she is on Enbrel for psoriatic arthritis.  She considers her self immune compromised.  She also has ulcerative colitis.  She is on multiple medications. She states she has had a "sinus infection" for 5 days.  Postnasal drip.  Sinus pressure.  Mild sore throat.  Clear mucus.  She is treating this with allergy medicine No fever or chills, no body aches, no cough, no shortness of breath  Past Medical History:  Diagnosis Date  . Abnormal Pap smear   . Allergy   . Anxiety    panic attacks - no meds currently  . Arthritis    hands hips shoulder  . Chest pain   . Headache   . HSV infection   . Lupus anticoagulant disorder (HCC)   . Pregnancy related carpal tunnel syndrome, antepartum 2013  . Thrombocytopenia complicating pregnancy (HCC)    2017 pregnancy  . Ulcerative colitis (HCC)    dx 2014, Dr. Loreta Ave    Patient Active Problem List   Diagnosis Date Noted  . GAD (generalized anxiety disorder) 02/06/2018  . Lupus anticoagulant positive 09/28/2016  . S/P cesarean section 10/05/2015  . Viral meningitis 01/07/2015  . Psoriatic arthritis (HCC) 01/07/2013  . Psoriasis 12/26/2012  . Ulcerative colitis (HCC) 12/26/2012    Past Surgical History:  Procedure Laterality Date  . CESAREAN SECTION  11/16/2011   Procedure: CESAREAN SECTION;  Surgeon: Jeani Hawking, MD;  Location: [facility];  Service: Gynecology;  Laterality: N/A;  . CESAREAN SECTION N/A 10/05/2015   Procedure: CESAREAN SECTION REPEAT;  Surgeon: Zelphia Cairo, MD;  Location: [facility];  Service: Obstetrics;  Laterality: N/A;  [facility] T to [facility] - confirmed edc 10/12/15  . LEEP    . WISDOM TOOTH EXTRACTION      OB History    Gravida  2   Para  2   Term  2   Preterm  0   AB  0   Living  2     SAB  0   TAB  0   Ectopic  0   Multiple  0   Live Births  2            Home Medications    Prior to Admission medications   Medication Sig Start Date End Date Taking? Authorizing Provider  buPROPion (WELLBUTRIN XL) 150 MG 24 hr tablet Take 1 tablet by mouth daily. 11/24/16   [provider]  clobetasol ointment (TEMOVATE) 0.05 % Apply 1 application topically 2 (two) times daily. 10-14 days. 02/15/18   Jerene Bears, MD  ENBREL SURECLICK 50 MG/ML injection Inject 50 mg into the skin once a week. 12/18/16   [provider]  FLUoxetine (PROZAC) 10 MG capsule Take 1 capsule by mouth daily. 08/27/17   [provider]  fluticasone (FLONASE) 50 MCG/ACT nasal spray Place 1 spray into both nostrils daily.    [provider]  loratadine (CLARITIN) 10 MG tablet Take by  mouth daily.    [provider]  LORazepam (ATIVAN) 0.5 MG tablet Take 1 tablet by mouth as needed. 10/16/16   [provider]  mesalamine (CANASA) 1000 MG suppository Place 1,000 mg rectally at bedtime.    [provider]  MOTEGRITY 2 MG TABS Take 1 tablet by mouth daily. 02/06/18   [provider]  TURMERIC PO Take by mouth daily.    [provider]  valACYclovir (VALTREX) 500 MG tablet Take 1 tablet (500 mg total) by mouth daily. 10/22/18   Jerene BearsMiller, Mary S, MD  norethindrone (MICRONOR,CAMILA,ERRIN) 0.35 MG tablet Take 1 tablet (0.35 mg total) by mouth daily. Patient not taking: Reported on 02/12/2018 09/18/17 03/30/19  Jerene BearsMiller, Mary S, MD    Family History Family History  Problem Relation Age of Onset  . Heart disease Father   . Arthritis Father   . Hypertension Maternal Grandmother   . Thrombophlebitis Maternal  Grandmother        has had "blood clots"  . Thyroid disease Maternal Grandmother   . Kidney disease Maternal Grandmother   . Colitis Brother   . Diabetes Cousin     Social History Social History   Tobacco Use  . Smoking status: Former Smoker    Packs/day: 0.25    Years: 4.00    Pack years: 1.00    Types: Cigarettes    Quit date: 04/24/2004    Years since quitting: 14.9  . Smokeless tobacco: Never Used  Substance Use Topics  . Alcohol use: Yes    Alcohol/week: 1.0 standard drinks    Types: 1 Glasses of wine per week    Comment: socially  . Drug use: No     Allergies   Latex and Zyrtec [cetirizine hcl]   Review of Systems Review of Systems  Constitutional: Negative for chills and fever.  HENT: Positive for congestion, postnasal drip, rhinorrhea, sinus pressure and sore throat. Negative for ear pain and sinus pain.   Eyes: Negative for pain and visual disturbance.  Respiratory: Negative for cough and shortness of breath.   Cardiovascular: Negative for chest pain and palpitations.  Gastrointestinal: Negative for abdominal pain and vomiting.  Genitourinary: Negative for dysuria and hematuria.  Musculoskeletal: Negative for arthralgias and back pain.  Skin: Negative for color change and rash.  Neurological: Negative for seizures and syncope.  All other systems reviewed and are negative.    Physical Exam Triage Vital Signs ED Triage Vitals  Enc Vitals Group     BP 03/30/19 1639 134/77     Pulse Rate 03/30/19 1639 98     Resp 03/30/19 1639 15     Temp 03/30/19 1639 98.1 F (36.7 C)     Temp Source 03/30/19 1639 Oral     SpO2 03/30/19 1639 100 %     Weight 03/30/19 1637 142 lb (64.4 kg)     Height --      Head Circumference --      Peak Flow --      Pain Score 03/30/19 1637 0     Pain Loc --      Pain Edu? --      Excl. in GC? --    No data found.  Updated Vital Signs BP 134/77 (BP Location: Left Arm)   Pulse 98   Temp 98.1 F (36.7 C) (Oral)   Resp 15    Wt 64.4 kg   SpO2 100%   BMI 23.63 kg/m      Physical Exam Constitutional:  General: She is not in acute distress.    Appearance: She is well-developed and normal weight. She is not ill-appearing.  HENT:     Head: Normocephalic and atraumatic.     Nose: Congestion and rhinorrhea present.     Comments: Clear rhinorrhea    Mouth/Throat:     Pharynx: Posterior oropharyngeal erythema present.     Tonsils: No tonsillar exudate or tonsillar abscesses. 0 on the right. 0 on the left.     Comments: Posterior pharynx mildly injected Eyes:     Conjunctiva/sclera: Conjunctivae normal.     Pupils: Pupils are equal, round, and reactive to light.  Neck:     Musculoskeletal: Normal range of motion.  Cardiovascular:     Rate and Rhythm: Normal rate.  Pulmonary:     Effort: Pulmonary effort is normal. No respiratory distress.  Abdominal:     General: There is no distension.     Palpations: Abdomen is soft.  Musculoskeletal: Normal range of motion.  Lymphadenopathy:     Cervical: No cervical adenopathy.  Skin:    General: Skin is warm and dry.  Neurological:     Mental Status: She is alert.  Psychiatric:        Mood and Affect: Mood normal.        Behavior: Behavior normal.      UC Treatments / Results  Labs (all labs ordered are listed, but only abnormal results are displayed) Labs Reviewed  NOVEL CORONAVIRUS, NAA (HOSP ORDER, SEND-OUT TO REF LAB; TAT 18-24 HRS)    EKG   Radiology No results found.  Procedures Procedures (including critical care time)  Medications Ordered in UC Medications - No data to display  Initial Impression / Assessment and Plan / UC Course  I have reviewed the triage vital signs and the nursing notes.  Pertinent labs & imaging results that were available during my care of the patient were reviewed by me and considered in my medical decision making (see chart for details).     I explained that the sinus infection is likely caused by a  virus.  She should continue with symptomatic care.  We reviewed coronavirus testing, how to get results, what symptoms to watch for.  Call for problems Final Clinical Impressions(s) / UC Diagnoses   Final diagnoses:  Close exposure to COVID-19 virus     Discharge Instructions     Check for results on MyChart Quarantine until results are in   ED Prescriptions    None     PDMP not reviewed this encounter.   Raylene Everts, MD 03/30/19 1755

## 2019-03-30 NOTE — ED Triage Notes (Signed)
Patient presents to Urgent Care with complaints of sore throat and sinus pressure since 5 days ago. Patient reports she has been treating her sx w/ allergy meds and they have been working. Pt had covid exposure all last week, found out the contact was positive earlier today.

## 2019-03-30 NOTE — Discharge Instructions (Addendum)
Check for results on MyChart Quarantine until results are in

## 2019-03-31 LAB — NOVEL CORONAVIRUS, NAA (HOSP ORDER, SEND-OUT TO REF LAB; TAT 18-24 HRS): SARS-CoV-2, NAA: NOT DETECTED

## 2019-04-11 ENCOUNTER — Encounter (INDEPENDENT_AMBULATORY_CARE_PROVIDER_SITE_OTHER): Payer: BC Managed Care – PPO | Admitting: Ophthalmology

## 2019-04-11 DIAGNOSIS — H3581 Retinal edema: Secondary | ICD-10-CM

## 2019-04-14 NOTE — Progress Notes (Signed)
[facility] Note  04/15/2019     CHIEF COMPLAINT Patient presents for Retina Evaluation   HISTORY OF PRESENT ILLNESS: Ariel Pierce is a 38 y.o. female who presents to the clinic today for:   HPI    Retina Evaluation    In both eyes.  This started months ago.  Duration of months.  Associated Symptoms Pain, Redness, Photophobia and Glare.  Context:  distance vision, mid-range vision and near vision.  Treatments tried include no treatments.  I, the attending physician,  performed the HPI with the patient and updated documentation appropriately.          Comments    38 y/o female pt referred by Dr. Posey Pronto (optometrist) for concern of uveitis.  Over the last few mos, pt has been unable to wear her cl due to them causing her pain, redness, fbs, photophobia and glare.  Symptoms only seem to flare up when trying to wear cl.  VA excellent both eyes with current glasses.  Denies pain, flashes, floaters.  No gtts.  On Enbrel.       Last edited by Bernarda Caffey, MD on 04/15/2019 12:18 PM. (History)    pt is here on the referral of her rheumatologist (she has psoriatic arthritis), pt states she was supposed to have been seen back in March/April, but put the appt off bc of covid, pt is taking Enbrel, she states she wears contact lenses, but is unable to wear them anymore bc her left eye gets too inflamed, pt states she is occasionally light sensitive and she has just started getting migraines, pts dr is concerned she has uveitis and would need to take her off the Enbrel, pts general eye dr is Dr. Gwynn Burly who she has seen in the past for irritation, pt states she also has lupus, pt has been on Enbrel for about a year and a half and has not had any side effects  Referring physician: Quintin Alto, MD [address],  [place] [address]  HISTORICAL INFORMATION:   Selected notes from the MEDICAL RECORD NUMBER Referral from Dr. Posey Pronto for ret  eval.   CURRENT MEDICATIONS: No current outpatient medications on file. (Ophthalmic Drugs)   No current facility-administered medications for this visit. (Ophthalmic Drugs)   Current Outpatient Medications (Other)  Medication Sig  . buPROPion (WELLBUTRIN XL) 300 MG 24 hr tablet Take 300 mg by mouth every morning.  . Cholecalciferol 25 MCG (1000 UT) tablet Take by mouth.  Marland Kitchen CLARITIN-D 24 HOUR 10-240 MG 24 hr tablet Take 1 tablet by mouth daily.  . clobetasol ointment (TEMOVATE) 8.14 % Apply 1 application topically 2 (two) times daily. 10-14 days.  Scarlette Shorts SURECLICK 50 MG/ML injection Inject 50 mg into the skin once a week.  Marland Kitchen FLUoxetine (PROZAC) 10 MG capsule Take 1 capsule by mouth daily.  . fluticasone (FLONASE) 50 MCG/ACT nasal spray Place 1 spray into both nostrils daily.  . folic acid (FOLVITE) 481 MCG tablet Take by mouth.  . halobetasol (ULTRAVATE) 0.05 % ointment APP AA ON THE SKIN BID PRF FLARES  . loratadine (CLARITIN) 10 MG tablet Take by mouth daily.  Marland Kitchen LORazepam (ATIVAN) 0.5 MG tablet Take 1 tablet by mouth as needed.  . mesalamine (CANASA) 1000 MG suppository Place 1,000 mg rectally at bedtime.  Marland Kitchen MOTEGRITY 2 MG TABS Take 1 tablet by mouth daily.  Marland Kitchen nystatin-triamcinolone (MYCOLOG II) cream Apply topically.  Marland Kitchen oxyCODONE-acetaminophen (PERCOCET/ROXICET) 5-325 MG  tablet Take 1 tablet by mouth every 6 (six) hours as needed.  . TURMERIC PO Take by mouth daily.  Marland Kitchen buPROPion (WELLBUTRIN XL) 150 MG 24 hr tablet Take 1 tablet by mouth daily.  . Turmeric POWD Take by mouth.  . valACYclovir (VALTREX) 500 MG tablet Take 1 tablet (500 mg total) by mouth daily. (Patient not taking: Reported on 04/15/2019)   No current facility-administered medications for this visit. (Other)      REVIEW OF SYSTEMS: ROS    Positive for: Skin, Musculoskeletal, Eyes   Negative for: Constitutional, Gastrointestinal, Neurological, Genitourinary, HENT, Endocrine, Cardiovascular, Respiratory,  Psychiatric, Allergic/Imm, Heme/Lymph   Last edited by Matthew Folks, COA on 04/15/2019  9:51 AM. (History)       ALLERGIES Allergies  Allergen Reactions  . Latex Rash  . Zyrtec [Cetirizine Hcl] Swelling    PAST MEDICAL HISTORY Past Medical History:  Diagnosis Date  . Abnormal Pap smear   . Allergy   . Anxiety    panic attacks - no meds currently  . Arthritis    hands hips shoulder  . Chest pain   . Headache   . HSV infection   . Lupus anticoagulant disorder ([place])   . Pregnancy related carpal tunnel syndrome, antepartum 2013  . Thrombocytopenia complicating pregnancy ([place])    2017 pregnancy  . Ulcerative colitis ([place])    dx 2014, Dr. Collene Mares   Past Surgical History:  Procedure Laterality Date  . CESAREAN SECTION  11/16/2011   Procedure: CESAREAN SECTION;  Surgeon: Cyril Mourning, MD;  Location: [facility];  Service: Gynecology;  Laterality: N/A;  . CESAREAN SECTION N/A 10/05/2015   Procedure: CESAREAN SECTION REPEAT;  Surgeon: Marylynn Pearson, MD;  Location: [facility];  Service: Obstetrics;  Laterality: N/A;  Linus Orn T to RNFA - confirmed edc 10/12/15  . LEEP    . WISDOM TOOTH EXTRACTION      FAMILY HISTORY Family History  Problem Relation Age of Onset  . Heart disease Father   . Arthritis Father   . Hypertension Maternal Grandmother   . Thrombophlebitis Maternal Grandmother        has had "blood clots"  . Thyroid disease Maternal Grandmother   . Kidney disease Maternal Grandmother   . Macular degeneration Maternal Grandmother   . Colitis Brother   . Diabetes Cousin     SOCIAL HISTORY Social History   Tobacco Use  . Smoking status: Former Smoker    Packs/day: 0.25    Years: 4.00    Pack years: 1.00    Types: Cigarettes    Quit date: 04/24/2004    Years since quitting: 14.9  . Smokeless tobacco: Never Used  Substance Use Topics  . Alcohol use: Yes    Alcohol/week: 1.0 standard drinks    Types: 1 Glasses of wine per week    Comment: socially   . Drug use: No         OPHTHALMIC EXAM:  Base Eye Exam    Visual Acuity (Snellen - Linear)      Right Left   Dist cc 20/20 +2 20/15 +   Correction: Glasses       Tonometry (Tonopen, 9:58 AM)      Right Left   Pressure 17 14       Pupils      Pupils Dark Light Shape React APD   Right PERRL 4 3 Round Brisk None   Left PERRL 4 3 Round Brisk None  Visual Fields (Counting fingers)      Left Right    Full Full       Extraocular Movement      Right Left    Full, Ortho Full, Ortho       Neuro/Psych    Oriented x3: Yes   Mood/Affect: Normal       Dilation    Both eyes: 1.0% Mydriacyl, 2.5% Phenylephrine @ 9:58 AM        Additional Tests    Color      Right Left   [place] 21/25 23/25        Slit Lamp and Fundus Exam    Slit Lamp Exam      Right Left   Lids/Lashes Normal Normal   Conjunctiva/Sclera White and quiet White and quiet   Cornea Clear Trace Punctate epithelial erosions   Anterior Chamber Deep and quiet, no cell or flare Deep and quiet, no cell or flare   Iris Round and dilated Round and dilated   Lens Clear Clear   Vitreous Vitreous syneresis, no cell mild Vitreous syneresis, no cell       Fundus Exam      Right Left   Disc Pink and Sharp Pink and Sharp   C/D Ratio 0.2 0.1   Macula Flat, Good foveal reflex, No heme or edema Flat, Good foveal reflex, No heme or edema   Vessels Mild Vascular attenuation, Tortuousity, no sheathing Mild Vascular attenuation, Tortuousity, no sheathing   Periphery Attached, very mild lattice temporal periphery  Attached, cystoid degeneration, early lattice temporally        Refraction    Wearing Rx      Sphere Cylinder   Right -1.25 Sphere   Left -1.50 Sphere   Age: 67yr  Type: SVL       Manifest Refraction      Sphere Cylinder Dist VA   Right -1.25 Sphere 20/20+2   Left -1.50 Sphere 20/15+          IMAGING AND PROCEDURES  Imaging and Procedures for @TODAY @  OCT, Retina - OU - Both Eyes        Right Eye Quality was good. Central Foveal Thickness: 286. Progression has no prior data. Findings include normal foveal contour, no IRF, no SRF.   Left Eye Quality was good. Central Foveal Thickness: 303. Progression has no prior data. Findings include normal foveal contour, no IRF, no SRF.   Notes *Images captured and stored on drive  Diagnosis / Impression:  NFP, no IRF/SRF OU No CME OU Clinical management:  See below  Abbreviations: NFP - Normal foveal profile. CME - cystoid macular edema. PED - pigment epithelial detachment. IRF - intraretinal fluid. SRF - subretinal fluid. EZ - ellipsoid zone. ERM - epiretinal membrane. ORA - outer retinal atrophy. ORT - outer retinal tubulation. SRHM - subretinal hyper-reflective material                 ASSESSMENT/PLAN:    ICD-10-CM   1. Psoriatic arthritis ([place]  L40.50   2. Lupus anticoagulant positive  R76.0   3. Retinal edema  H35.81 OCT, Retina - OU - Both Eyes    1,2. Psoriatic arthritis and SLE -- no active ocular inflammation / uveitis OU - referred by Rheumatology - currently on Enbrel without adverse reaction - reports history of contact lens intolerance OS that improves with Benadryl - discussed FA as test to rule out occult vasculitis / posterior uveitis -- low suspicion -- pt  defers testing at this time -- reasonable  3. No retinal edema on exam or OCT  **Has appt at [facility] in January -- unknown provider -- for establishment of primary ophthalmology care** - recommend Dr. Ellie Lunch, if available   Ophthalmic Meds Ordered this visit:  No orders of the defined types were placed in this encounter.      Return if symptoms worsen or fail to improve.  There are no Patient Instructions on file for this visit.   Explained the diagnoses, plan, and follow up with the patient and they expressed understanding.  Patient expressed understanding of the importance of proper follow up care.   This  document serves as a record of services personally performed by Gardiner Sleeper, MD, PhD. It was created on their behalf by Estill Bakes, COT an ophthalmic technician. The creation of this record is the provider's dictation and/or activities during the visit.    Electronically signed by: Estill Bakes, COT 04/14/19 @ 12:53 AM  Gardiner Sleeper, M.D., Ph.D. Diseases & Surgery of the Retina and Vitreous [facility] 04/15/2019   I have reviewed the above documentation for accuracy and completeness, and I agree with the above. Gardiner Sleeper, M.D., Ph.D. 04/17/19 12:53 AM   Abbreviations: M myopia (nearsighted); A astigmatism; H hyperopia (farsighted); P presbyopia; Mrx spectacle prescription;  CTL contact lenses; OD right eye; OS left eye; OU both eyes  XT exotropia; ET esotropia; PEK punctate epithelial keratitis; PEE punctate epithelial erosions; DES dry eye syndrome; MGD meibomian gland dysfunction; ATs artificial tears; PFAT's preservative free artificial tears; [place] nuclear sclerotic cataract; PSC posterior subcapsular cataract; ERM epi-retinal membrane; PVD posterior vitreous detachment; RD retinal detachment; DM diabetes mellitus; DR diabetic retinopathy; NPDR non-proliferative diabetic retinopathy; PDR proliferative diabetic retinopathy; CSME clinically significant macular edema; DME diabetic macular edema; dbh dot blot hemorrhages; CWS cotton wool spot; POAG primary open angle glaucoma; C/D cup-to-disc ratio; HVF humphrey visual field; GVF goldmann visual field; OCT optical coherence tomography; IOP intraocular pressure; BRVO Branch retinal vein occlusion; CRVO central retinal vein occlusion; CRAO central retinal artery occlusion; BRAO branch retinal artery occlusion; RT retinal tear; SB scleral buckle; PPV pars plana vitrectomy; VH Vitreous hemorrhage; PRP panretinal laser photocoagulation; IVK intravitreal kenalog; VMT vitreomacular traction; MH Macular hole;  NVD  neovascularization of the disc; NVE neovascularization elsewhere; AREDS age related eye disease study; ARMD age related macular degeneration; POAG primary open angle glaucoma; EBMD epithelial/anterior basement membrane dystrophy; ACIOL anterior chamber intraocular lens; IOL intraocular lens; PCIOL posterior chamber intraocular lens; Phaco/IOL phacoemulsification with intraocular lens placement; [place] photorefractive keratectomy; LASIK laser assisted in situ keratomileusis; HTN hypertension; DM diabetes mellitus; COPD chronic obstructive pulmonary disease

## 2019-04-15 ENCOUNTER — Encounter (INDEPENDENT_AMBULATORY_CARE_PROVIDER_SITE_OTHER): Payer: Self-pay | Admitting: Ophthalmology

## 2019-04-15 ENCOUNTER — Ambulatory Visit (INDEPENDENT_AMBULATORY_CARE_PROVIDER_SITE_OTHER): Payer: BC Managed Care – PPO | Admitting: Ophthalmology

## 2019-04-15 DIAGNOSIS — H3581 Retinal edema: Secondary | ICD-10-CM | POA: Diagnosis not present

## 2019-04-15 DIAGNOSIS — L4050 Arthropathic psoriasis, unspecified: Secondary | ICD-10-CM | POA: Diagnosis not present

## 2019-04-15 DIAGNOSIS — R760 Raised antibody titer: Secondary | ICD-10-CM

## 2019-04-28 ENCOUNTER — Other Ambulatory Visit: Payer: Self-pay | Admitting: Obstetrics & Gynecology

## 2019-04-28 NOTE — Telephone Encounter (Signed)
Medication refill request: Valtrex 500mg  #90, R0 Last AEX:  02-12-18 Next AEX: 05-23-19 Last MMG (if hormonal medication request): n/a Refill authorized: Please refill if appropriate

## 2019-05-23 ENCOUNTER — Ambulatory Visit: Payer: BLUE CROSS/BLUE SHIELD | Admitting: Obstetrics & Gynecology

## 2019-06-13 ENCOUNTER — Other Ambulatory Visit: Payer: Self-pay

## 2019-06-16 ENCOUNTER — Ambulatory Visit: Payer: 59 | Admitting: Obstetrics & Gynecology

## 2019-06-16 ENCOUNTER — Telehealth: Payer: Self-pay | Admitting: Obstetrics & Gynecology

## 2019-06-16 NOTE — Telephone Encounter (Signed)
Patient canceled her aex today and rescheduled due to starting her cycle.

## 2019-06-16 NOTE — Progress Notes (Deleted)
39 y.o. G19P2002 Married White or Caucasian female here for annual exam.    No LMP recorded.          Sexually active: {yes no:314532}  The current method of family planning is vasectomy.    Exercising: {yes no:314532}  {types:19826} Smoker:  no  Health Maintenance: Pap:   02/12/18 Neg  12/26/16 Neg. HR HPV:neg             11/15/15 Neg  History of abnormal Pap:  Yes, LEEP 2012 MMG:  n/a Colonoscopy:  2014 Ulcerative Colitis  BMD:   n/a TDaP:  2017 Pneumonia vaccine(s):  n/a Shingrix:   No Hep C testing: n/a Screening Labs: ***   reports that she quit smoking about 15 years ago. Her smoking use included cigarettes. She has a 1.00 pack-year smoking history. She has never used smokeless tobacco. She reports current alcohol use of about 1.0 standard drinks of alcohol per week. She reports that she does not use drugs.  Past Medical History:  Diagnosis Date  . Abnormal Pap smear   . Allergy   . Anxiety    panic attacks - no meds currently  . Arthritis    hands hips shoulder  . Chest pain   . Headache   . HSV infection   . Lupus anticoagulant disorder ([facility])   . Pregnancy related carpal tunnel syndrome, antepartum 2013  . Thrombocytopenia complicating pregnancy ([facility])    2017 pregnancy  . Ulcerative colitis ([facility])    dx 2014, Dr. Loreta Ave    Past Surgical History:  Procedure Laterality Date  . CESAREAN SECTION  11/16/2011   Procedure: CESAREAN SECTION;  Surgeon: Jeani Hawking, MD;  Location: [facility];  Service: Gynecology;  Laterality: N/A;  . CESAREAN SECTION N/A 10/05/2015   Procedure: CESAREAN SECTION REPEAT;  Surgeon: Zelphia Cairo, MD;  Location: [facility];  Service: Obstetrics;  Laterality: N/A;  [facility] T to RNFA - confirmed edc 10/12/15  . LEEP    . WISDOM TOOTH EXTRACTION      Current Outpatient Medications  Medication Sig Dispense Refill  . buPROPion (WELLBUTRIN XL) 150 MG 24 hr tablet Take 1 tablet by mouth daily.    Marland Kitchen buPROPion (WELLBUTRIN XL) 300 MG 24 hr  tablet Take 300 mg by mouth every morning.    . Cholecalciferol 25 MCG (1000 UT) tablet Take by mouth.    Marland Kitchen CLARITIN-D 24 HOUR 10-240 MG 24 hr tablet Take 1 tablet by mouth daily.    . clobetasol ointment (TEMOVATE) 0.05 % Apply 1 application topically 2 (two) times daily. 10-14 days. 30 g 0  . ENBREL SURECLICK 50 MG/ML injection Inject 50 mg into the skin once a week.    Marland Kitchen FLUoxetine (PROZAC) 10 MG capsule Take 1 capsule by mouth daily.  2  . fluticasone (FLONASE) 50 MCG/ACT nasal spray Place 1 spray into both nostrils daily.    . folic acid (FOLVITE) 800 MCG tablet Take by mouth.    . halobetasol (ULTRAVATE) 0.05 % ointment APP AA ON THE SKIN BID PRF FLARES    . loratadine (CLARITIN) 10 MG tablet Take by mouth daily.    Marland Kitchen LORazepam (ATIVAN) 0.5 MG tablet Take 1 tablet by mouth as needed.    . mesalamine (CANASA) 1000 MG suppository Place 1,000 mg rectally at bedtime.    Marland Kitchen MOTEGRITY 2 MG TABS Take 1 tablet by mouth daily.    Marland Kitchen nystatin-triamcinolone (MYCOLOG II) cream Apply topically.    Marland Kitchen oxyCODONE-acetaminophen (PERCOCET/ROXICET) 5-325 MG  tablet Take 1 tablet by mouth every 6 (six) hours as needed.    . TURMERIC PO Take by mouth daily.    . Turmeric POWD Take by mouth.    . valACYclovir (VALTREX) 500 MG tablet TAKE 1 TABLET BY MOUTH EVERY DAY 90 tablet 0   No current facility-administered medications for this visit.    Family History  Problem Relation Age of Onset  . Heart disease Father   . Arthritis Father   . Hypertension Maternal Grandmother   . Thrombophlebitis Maternal Grandmother        has had "blood clots"  . Thyroid disease Maternal Grandmother   . Kidney disease Maternal Grandmother   . Macular degeneration Maternal Grandmother   . Colitis Brother   . Diabetes Cousin     Review of Systems  Exam:   There were no vitals taken for this visit.  Height:      Ht Readings from Last 3 Encounters:  02/12/18 5\' 5"  (1.651 m)  09/18/17 5\' 5"  (1.651 m)  08/30/17 5\' 5"   (1.651 m)    General appearance: alert, cooperative and appears stated age Head: Normocephalic, without obvious abnormality, atraumatic Neck: no adenopathy, supple, symmetrical, trachea midline and thyroid {EXAM; THYROID:18604} Lungs: clear to auscultation bilaterally Breasts: {Exam; breast:13139::"normal appearance, no masses or tenderness"} Heart: regular rate and rhythm Abdomen: soft, non-tender; bowel sounds normal; no masses,  no organomegaly Extremities: extremities normal, atraumatic, no cyanosis or edema Skin: Skin color, texture, turgor normal. No rashes or lesions Lymph nodes: Cervical, supraclavicular, and axillary nodes normal. No abnormal inguinal nodes palpated Neurologic: Grossly normal   Pelvic: External genitalia:  no lesions              Urethra:  normal appearing urethra with no masses, tenderness or lesions              Bartholins and Skenes: normal                 Vagina: normal appearing vagina with normal color and discharge, no lesions              Cervix: {exam; cervix:14595}              Pap taken: {yes no:314532} Bimanual Exam:  Uterus:  {exam; uterus:12215}              Adnexa: {exam; adnexa:12223}               Rectovaginal: Confirms               Anus:  normal sphincter tone, no lesions  Chaperone, ***Terence Lux, CMA, was present for exam.  A:  Well Woman with normal exam  P:   {plan; gyn:5269::"mammogram","pap smear","return annually or prn"}

## 2019-07-04 ENCOUNTER — Other Ambulatory Visit: Payer: Self-pay

## 2019-07-07 ENCOUNTER — Ambulatory Visit (INDEPENDENT_AMBULATORY_CARE_PROVIDER_SITE_OTHER): Payer: 59 | Admitting: Obstetrics & Gynecology

## 2019-07-07 ENCOUNTER — Other Ambulatory Visit: Payer: Self-pay

## 2019-07-07 ENCOUNTER — Encounter: Payer: Self-pay | Admitting: Obstetrics & Gynecology

## 2019-07-07 ENCOUNTER — Other Ambulatory Visit (HOSPITAL_COMMUNITY)
Admission: RE | Admit: 2019-07-07 | Discharge: 2019-07-07 | Disposition: A | Discharge status: Home / Self Care | Payer: 59 | Source: Ambulatory Visit | Attending: Obstetrics & Gynecology | Admitting: Obstetrics & Gynecology

## 2019-07-07 VITALS — BP 110/60 | HR 84 | Temp 98.2°F | Resp 10 | Ht 65.0 in | Wt 147.0 lb

## 2019-07-07 DIAGNOSIS — Z124 Encounter for screening for malignant neoplasm of cervix: Secondary | ICD-10-CM

## 2019-07-07 DIAGNOSIS — L4050 Arthropathic psoriasis, unspecified: Secondary | ICD-10-CM

## 2019-07-07 DIAGNOSIS — Z01419 Encounter for gynecological examination (general) (routine) without abnormal findings: Secondary | ICD-10-CM | POA: Diagnosis not present

## 2019-07-07 MED ORDER — VALACYCLOVIR HCL 500 MG PO TABS: 500.0000 mg | ORAL_TABLET | Freq: Every day | ORAL | 4 refills | Status: DC

## 2019-07-07 NOTE — Progress Notes (Signed)
39 y.o. G2P2002 Married White or Caucasian female here for annual exam.  Doing well.  Cycles have been a little irregular in January and February but now it seems back to normal.  She does have occasional nipple itching.  Uses topical steroid.      Patient's last menstrual period was 06/15/2019.          Sexually active: Yes.    The current method of family planning is vasectomy.    Exercising: Yes.    weight lifting Smoker:  no  Health Maintenance: Pap:   02/12/18 Neg  12/26/16 Neg. HR HPV:neg             11/15/15 Neg  History of abnormal Pap:  Yes, LEEP 2012 MMG:  never Colonoscopy:  2014 Ulcerative Colitis BMD:   n/a TDaP:  2017 Hep C testing: 2018 Neg Screening Labs: discuss today   reports that she quit smoking about 15 years ago. Her smoking use included cigarettes. She has a 1.00 pack-year smoking history. She has never used smokeless tobacco. She reports current alcohol use of about 1.0 standard drinks of alcohol per week. She reports that she does not use drugs.  Past Medical History:  Diagnosis Date  . Abnormal Pap smear    h/o LEEP 2012, neg paps since  . Allergy   . Anxiety    panic attacks - no meds currently  . Arthritis    hands hips shoulder  . Headache   . HSV infection   . Lupus anticoagulant disorder ([place])   . Pregnancy related carpal tunnel syndrome, antepartum 2013  . Psoriatic arthritis ([place])   . Thrombocytopenia complicating pregnancy ([place])    2017 pregnancy  . Ulcerative colitis ([place])    dx 2014, Dr. Collene Mares    Past Surgical History:  Procedure Laterality Date  . CESAREAN SECTION  11/16/2011   Procedure: CESAREAN SECTION;  Surgeon: Cyril Mourning, MD;  Location: [facility];  Service: Gynecology;  Laterality: N/A;  . CESAREAN SECTION N/A 10/05/2015   Procedure: CESAREAN SECTION REPEAT;  Surgeon: Marylynn Pearson, MD;  Location: [facility];  Service: Obstetrics;  Laterality: N/A;  Linus Orn T to RNFA - confirmed edc 10/12/15  . LEEP    . MOUTH SURGERY     . WISDOM TOOTH EXTRACTION      Current Outpatient Medications  Medication Sig Dispense Refill  . buPROPion (WELLBUTRIN XL) 150 MG 24 hr tablet Take 1 tablet by mouth daily.    . Cholecalciferol 25 MCG (1000 UT) tablet Take by mouth.    Marland Kitchen CLARITIN-D 24 HOUR 10-240 MG 24 hr tablet Take 1 tablet by mouth daily.    . clobetasol ointment (TEMOVATE) 2.95 % Apply 1 application topically 2 (two) times daily. 10-14 days. 30 g 0  . ENBREL SURECLICK 50 MG/ML injection Inject 50 mg into the skin once a week.    Marland Kitchen FLUoxetine (PROZAC) 10 MG capsule Take 1 capsule by mouth daily.  2  . halobetasol (ULTRAVATE) 0.05 % ointment APP AA ON THE SKIN BID PRF FLARES    . LORazepam (ATIVAN) 0.5 MG tablet Take 1 tablet by mouth as needed.    . mesalamine (CANASA) 1000 MG suppository Place 1,000 mg rectally at bedtime.    Marland Kitchen nystatin-triamcinolone (MYCOLOG II) cream Apply topically.    . triamcinolone (NASACORT ALLERGY 24HR) 55 MCG/ACT AERO nasal inhaler Place 2 sprays into the nose daily.    . valACYclovir (VALTREX) 500 MG tablet TAKE 1 TABLET BY MOUTH EVERY DAY  90 tablet 0   No current facility-administered medications for this visit.    Family History  Problem Relation Age of Onset  . Heart disease Father   . Arthritis Father   . Hypertension Maternal Grandmother   . Thrombophlebitis Maternal Grandmother        has had "blood clots"  . Thyroid disease Maternal Grandmother   . Kidney disease Maternal Grandmother   . Macular degeneration Maternal Grandmother   . Colitis Brother   . Diabetes Cousin     Review of Systems  All other systems reviewed and are negative.   Exam:   BP 110/60 (BP Location: Right Arm, Patient Position: Sitting, Cuff Size: Normal)   Pulse 84   Temp 98.2 F (36.8 C) (Temporal)   Resp 10   Ht 5\' 5"  (1.651 m)   Wt 147 lb (66.7 kg)   LMP 06/15/2019   BMI 24.46 kg/m    Height: 5\' 5"  (165.1 cm)  Ht Readings from Last 3 Encounters:  07/07/19 5\' 5"  (1.651 m)  02/12/18 5'  5" (1.651 m)  09/18/17 5\' 5"  (1.651 m)   General appearance: alert, cooperative and appears stated age Head: Normocephalic, without obvious abnormality, atraumatic Neck: no adenopathy, supple, symmetrical, trachea midline and thyroid normal to inspection and palpation Lungs: clear to auscultation bilaterally Breasts: normal appearance, no masses or tenderness Heart: regular rate and rhythm Abdomen: soft, non-tender; bowel sounds normal; no masses,  no organomegaly Extremities: extremities normal, atraumatic, no cyanosis or edema Skin: Skin color, texture, turgor normal. No rashes or lesions Lymph nodes: Cervical, supraclavicular, and axillary nodes normal. No abnormal inguinal nodes palpated Neurologic: Grossly normal   Pelvic: External genitalia:  no lesions              Urethra:  normal appearing urethra with no masses, tenderness or lesions              Bartholins and Skenes: normal                 Vagina: normal appearing vagina with normal color and discharge, no lesions              Cervix: no lesions              Pap taken: Yes.   Bimanual Exam:  Uterus:  normal size, contour, position, consistency, mobility, non-tender              Adnexa: normal adnexa and no mass, fullness, tenderness               Rectovaginal: Confirms               Anus:  normal sphincter tone, no lesions  Chaperone, , CMA, was present for exam.  A:  Well Woman with normal exam H/o Caesarean section x 2 Anxiety Lupus, psoriatic arthritis, and   P:   Mammogram guidelines reviewed.  Will start after her birthday. pap smear with HR HPV obtained today Will obtained CMP, CBC, CRP (hasn't done with rheumatology in over a year) Valtrex 500mg  daily.  #90/4RF Return annually or prn

## 2019-07-08 LAB — COMPREHENSIVE METABOLIC PANEL
ALT: 13 IU/L (ref 0–32)
AST: 20 IU/L (ref 0–40)
Albumin/Globulin Ratio: 2.2 (ref 1.2–2.2)
Albumin: 4.2 g/dL (ref 3.8–4.8)
Alkaline Phosphatase: 58 IU/L (ref 39–117)
BUN/Creatinine Ratio: 14 (ref 9–23)
BUN: 12 mg/dL (ref 6–20)
Bilirubin Total: 0.4 mg/dL (ref 0.0–1.2)
CO2: 25 mmol/L (ref 20–29)
Calcium: 8.7 mg/dL (ref 8.7–10.2)
Chloride: 105 mmol/L (ref 96–106)
Creatinine, Ser: 0.85 mg/dL (ref 0.57–1.00)
GFR calc Af Amer: 100 mL/min/{1.73_m2} (ref >59)
GFR calc non Af Amer: 87 mL/min/{1.73_m2} (ref >59)
Globulin, Total: 1.9 g/dL (ref 1.5–4.5)
Glucose: 80 mg/dL (ref 65–99)
Potassium: 4 mmol/L (ref 3.5–5.2)
Sodium: 143 mmol/L (ref 134–144)
Total Protein: 6.1 g/dL (ref 6.0–8.5)

## 2019-07-08 LAB — CBC
Hematocrit: 42.2 % (ref 34.0–46.6)
Hemoglobin: 14.3 g/dL (ref 11.1–15.9)
MCH: 33.6 pg — ABNORMAL HIGH (ref 26.6–33.0)
MCHC: 33.9 g/dL (ref 31.5–35.7)
MCV: 99 fL — ABNORMAL HIGH (ref 79–97)
Platelets: 184 10*3/uL (ref 150–450)
RBC: 4.25 x10E6/uL (ref 3.77–5.28)
RDW: 11.9 % (ref 11.7–15.4)
WBC: 4.1 10*3/uL (ref 3.4–10.8)

## 2019-07-08 LAB — C-REACTIVE PROTEIN: CRP: <1 mg/L (ref 0–10)

## 2019-07-08 LAB — CYTOLOGY - PAP
Adequacy: ABSENT
Comment: NEGATIVE
Diagnosis: NEGATIVE
High risk HPV: NEGATIVE

## 2019-10-06 ENCOUNTER — Other Ambulatory Visit: Payer: Self-pay | Admitting: Obstetrics & Gynecology

## 2019-10-06 DIAGNOSIS — Z1231 Encounter for screening mammogram for malignant neoplasm of breast: Secondary | ICD-10-CM

## 2019-10-07 ENCOUNTER — Other Ambulatory Visit: Payer: Self-pay

## 2019-10-07 ENCOUNTER — Ambulatory Visit
Admission: RE | Admit: 2019-10-07 | Discharge: 2019-10-07 | Disposition: A | Discharge status: Home / Self Care | Payer: 59 | Source: Ambulatory Visit | Attending: Obstetrics & Gynecology | Admitting: Obstetrics & Gynecology

## 2019-10-07 DIAGNOSIS — Z1231 Encounter for screening mammogram for malignant neoplasm of breast: Secondary | ICD-10-CM

## 2020-05-15 ENCOUNTER — Ambulatory Visit: Payer: Self-pay

## 2020-07-19 ENCOUNTER — Ambulatory Visit: Payer: 59

## 2020-09-22 ENCOUNTER — Other Ambulatory Visit: Payer: Self-pay | Admitting: Physical Medicine and Rehabilitation

## 2020-09-22 DIAGNOSIS — Z1231 Encounter for screening mammogram for malignant neoplasm of breast: Secondary | ICD-10-CM

## 2020-11-03 ENCOUNTER — Ambulatory Visit (HOSPITAL_BASED_OUTPATIENT_CLINIC_OR_DEPARTMENT_OTHER): Payer: 59 | Admitting: Obstetrics & Gynecology

## 2020-11-17 ENCOUNTER — Ambulatory Visit
Admission: RE | Admit: 2020-11-17 | Discharge: 2020-11-17 | Disposition: A | Discharge status: Home / Self Care | Payer: 59 | Source: Ambulatory Visit | Attending: Physical Medicine and Rehabilitation | Admitting: Physical Medicine and Rehabilitation

## 2020-11-17 ENCOUNTER — Other Ambulatory Visit: Payer: Self-pay

## 2020-11-17 DIAGNOSIS — Z1231 Encounter for screening mammogram for malignant neoplasm of breast: Secondary | ICD-10-CM

## 2020-12-16 ENCOUNTER — Other Ambulatory Visit: Payer: Self-pay

## 2020-12-16 ENCOUNTER — Ambulatory Visit (INDEPENDENT_AMBULATORY_CARE_PROVIDER_SITE_OTHER): Payer: 59 | Admitting: Obstetrics & Gynecology

## 2020-12-16 ENCOUNTER — Encounter (HOSPITAL_BASED_OUTPATIENT_CLINIC_OR_DEPARTMENT_OTHER): Payer: Self-pay | Admitting: Obstetrics & Gynecology

## 2020-12-16 VITALS — BP 125/73 | HR 86 | Ht 65.0 in | Wt 149.4 lb

## 2020-12-16 DIAGNOSIS — N393 Stress incontinence (female) (male): Secondary | ICD-10-CM

## 2020-12-16 DIAGNOSIS — Z9889 Other specified postprocedural states: Secondary | ICD-10-CM | POA: Diagnosis not present

## 2020-12-16 DIAGNOSIS — N920 Excessive and frequent menstruation with regular cycle: Secondary | ICD-10-CM

## 2020-12-16 DIAGNOSIS — R300 Dysuria: Secondary | ICD-10-CM

## 2020-12-16 DIAGNOSIS — L4050 Arthropathic psoriasis, unspecified: Secondary | ICD-10-CM

## 2020-12-16 DIAGNOSIS — K5190 Ulcerative colitis, unspecified, without complications: Secondary | ICD-10-CM

## 2020-12-16 DIAGNOSIS — R760 Raised antibody titer: Secondary | ICD-10-CM

## 2020-12-16 DIAGNOSIS — Z01419 Encounter for gynecological examination (general) (routine) without abnormal findings: Secondary | ICD-10-CM | POA: Diagnosis not present

## 2020-12-16 NOTE — Progress Notes (Signed)
40 y.o. G54P2002 Married White or Caucasian female here for annual exam.  Kids start school on Monday.  Doing well.    Cycles are regular but 21 days.    She gets acne and has been having issues with lip cracking.  Has been advised this is a vitamin deficiency.  Did a 15 days of diflucan.  This helped minimally.  Feels like her lips are worse right before her cycle.  She is having urinary leakage.  This is with cough, laugh, sneezing, jumping.  She also does feel she she some difficulty with fully emptying bowels.  For example, sometimes after bowel movements and when up an active, there can be small smears in her underwear.    Patient's last menstrual period was 12/07/2020 (exact date).          Sexually active: Yes.    The current method of family planning is vasectomy.    Exercising: Yes.     Smoker:  no  Health Maintenance: Pap:  07/07/19 neg, neg HR HPV History of abnormal Pap:  yes, LEEP 2012 MMG:  11/17/20 neg Colonoscopy:  guidelines reviewed TDaP:  2017 Screening Labs: done at [facility] (reviewed in Epic)   reports that she quit smoking about 16 years ago. Her smoking use included cigarettes. She has a 1.00 pack-year smoking history. She has never used smokeless tobacco. She reports current alcohol use of about 1.0 standard drink per week. She reports that she does not use drugs.  Past Medical History:  Diagnosis Date   Abnormal Pap smear    h/o LEEP 2012, neg paps since   Allergy    Anxiety    panic attacks - no meds currently   Arthritis    hands hips shoulder   Headache    HSV infection    Lupus anticoagulant disorder (HCC)    Pregnancy related carpal tunnel syndrome, antepartum 2013   Psoriatic arthritis (HCC)    Thrombocytopenia complicating pregnancy (HCC)    2017 pregnancy   Ulcerative colitis (HCC)    dx 2014, Dr. Loreta Ave    Past Surgical History:  Procedure Laterality Date   CESAREAN SECTION  11/16/2011   Procedure: CESAREAN SECTION;  Surgeon: Jeani Hawking,  MD;  Location: [facility];  Service: Gynecology;  Laterality: N/A;   CESAREAN SECTION N/A 10/05/2015   Procedure: CESAREAN SECTION REPEAT;  Surgeon: Zelphia Cairo, MD;  Location: [facility];  Service: Obstetrics;  Laterality: N/A;  [facility] T to [facility]FA - confirmed edc 10/12/15   LEEP     MOUTH SURGERY     WISDOM TOOTH EXTRACTION      Current Outpatient Medications  Medication Sig Dispense Refill   buPROPion (WELLBUTRIN XL) 150 MG 24 hr tablet Take 1 tablet by mouth daily.     Cholecalciferol 25 MCG (1000 UT) tablet Take by mouth.     CLARITIN-D 24 HOUR 10-240 MG 24 hr tablet Take 1 tablet by mouth daily.     clobetasol ointment (TEMOVATE) 0.05 % Apply 1 application topically 2 (two) times daily. 10-14 days. 30 g 0   FLUoxetine (PROZAC) 10 MG capsule Take 1 capsule by mouth daily.  2   halobetasol (ULTRAVATE) 0.05 % ointment APP AA ON THE SKIN BID PRF FLARES     LORazepam (ATIVAN) 0.5 MG tablet Take 1 tablet by mouth as needed.     mesalamine (CANASA) 1000 MG suppository Place 1,000 mg rectally at bedtime.     nystatin-triamcinolone (MYCOLOG II) cream Apply topically.  Risankizumab-rzaa (SKYRIZI PEN) 150 MG/ML SOAJ Inject into the skin.     triamcinolone (NASACORT) 55 MCG/ACT AERO nasal inhaler Place 2 sprays into the nose daily.     valACYclovir (VALTREX) 500 MG tablet Take 1 tablet (500 mg total) by mouth daily. 90 tablet 4   No current facility-administered medications for this visit.    Family History  Problem Relation Age of Onset   Hypertension Maternal Grandmother    Thrombophlebitis Maternal Grandmother        has had "blood clots"   Thyroid disease Maternal Grandmother    Kidney disease Maternal Grandmother    Macular degeneration Maternal Grandmother    Heart disease Father    Arthritis Father    Lymphoma Father    Colitis Brother    Diabetes Cousin     Review of Systems  Gastrointestinal:        Fecal soiling  Genitourinary:  Positive for menstrual problem.        Urinary incontinence  All other systems reviewed and are negative.  Exam:   BP 125/73   Pulse 86   Ht 5\' 5"  (1.651 m)   Wt 149 lb 6.4 oz (67.8 kg)   LMP 12/07/2020 (Exact Date)   BMI 24.86 kg/m   Height: 5\' 5"  (165.1 cm)  General appearance: alert, cooperative and appears stated age Head: Normocephalic, without obvious abnormality, atraumatic Neck: no adenopathy, supple, symmetrical, trachea midline and thyroid normal to inspection and palpation Lungs: clear to auscultation bilaterally Breasts: normal appearance, no masses or tenderness Heart: regular rate and rhythm Abdomen: soft, non-tender; bowel sounds normal; no masses,  no organomegaly Extremities: extremities normal, atraumatic, no cyanosis or edema Skin: Skin color, texture, turgor normal. No rashes or lesions Lymph nodes: Cervical, supraclavicular, and axillary nodes normal. No abnormal inguinal nodes palpated Neurologic: Grossly normal  Pelvic: External genitalia:  no lesions              Urethra:  normal appearing urethra with no masses, tenderness or lesions              Bartholins and Skenes: normal                 Vagina: normal appearing vagina with normal color and no discharge, no lesions              Cervix: no lesions              Pap taken: No. Bimanual Exam:  Uterus:  normal size, contour, position, consistency, mobility, non-tender              Adnexa: normal adnexa and no mass, fullness, tenderness               Rectovaginal: Confirms               Anus:  normal sphincter tone, no lesions  Chaperone, 12/09/2020, CMA, was present for exam.  Assessment/Plan: 1. Well woman exam with routine gynecological exam - pap 3/21 neg with neg HR HPV.  Not indicated today. - MMG 10/2020 - colonoscopy guidelines reviewed - Vaccines updated - lab work done with provider at [place] and in Care Everywhere  2. Stress incontinence - Ambulatory referral to Physical Therapy - Ambulatory referral to  Urogynecology  3. Dysuria - Urine Culture  4. History of loop electrical excision procedure (LEEP) - 2012  5. Lupus anticoagulant positive  6. Psoriatic arthritis (HCC)  7. Ulcerative colitis without complications, unspecified location (HCC)  8. Polymenorrhea -  pt will call for day 3 FSH/estradiol level  9. Chelitis

## 2020-12-18 LAB — URINE CULTURE: Organism ID, Bacteria: NO GROWTH

## 2020-12-19 ENCOUNTER — Encounter (HOSPITAL_BASED_OUTPATIENT_CLINIC_OR_DEPARTMENT_OTHER): Payer: Self-pay | Admitting: Obstetrics & Gynecology

## 2020-12-24 ENCOUNTER — Encounter (HOSPITAL_BASED_OUTPATIENT_CLINIC_OR_DEPARTMENT_OTHER): Payer: Self-pay | Admitting: Obstetrics & Gynecology

## 2021-01-03 ENCOUNTER — Encounter (HOSPITAL_BASED_OUTPATIENT_CLINIC_OR_DEPARTMENT_OTHER): Payer: Self-pay

## 2021-01-06 ENCOUNTER — Other Ambulatory Visit (HOSPITAL_BASED_OUTPATIENT_CLINIC_OR_DEPARTMENT_OTHER): Payer: Self-pay | Admitting: Obstetrics & Gynecology

## 2021-01-06 DIAGNOSIS — K130 Diseases of lips: Secondary | ICD-10-CM

## 2021-01-06 DIAGNOSIS — N920 Excessive and frequent menstruation with regular cycle: Secondary | ICD-10-CM

## 2021-01-06 NOTE — Progress Notes (Signed)
Lab orders placed.  

## 2021-02-03 ENCOUNTER — Ambulatory Visit: Payer: 59 | Admitting: Physical Therapy

## 2021-02-16 NOTE — Progress Notes (Addendum)
[facility] New Patient Evaluation and Consultation  Referring Provider: Jerene Bears, MD PCP: Eartha Inch, MD Date of Service: 02/18/2021  SUBJECTIVE Chief Complaint: New Patient (Initial Visit) (Urinary/bowel incontinence)  History of Present Illness: Ariel Pierce is a 40 y.o. White or Caucasian female seen in consultation at the request of Dr. Hyacinth Meeker for evaluation of incontinence.    Review of records from Dr Millersignificant for: Has leakage of urine with cough/ laugh/ sneeze. Has noticed some fecal smearing as well.   Urinary Symptoms: Leaks urine with cough/ sneeze, exercise, lifting, going from sitting to standing, and with urgency. SUI> UUI.  Leaks 1-2 time(s) per day.  Pad use: 1-2 liners/ mini-pads per day.   She is bothered by her UI symptoms.  Day time voids 2-5.  Nocturia: 1-2 times per night to void. Voiding dysfunction: she does not empty her bladder well.  does not use a catheter to empty bladder.  When urinating, she feels a weak stream, dribbling after finishing, and the need to urinate multiple times in a row  UTIs:  0  UTI's in the last year.   Denies history of blood in urine and kidney or bladder stones  Pelvic Organ Prolapse Symptoms:                  She Denies a feeling of a bulge the vaginal area.   Bowel Symptom: Bowel movements: every 3-4 days Stool consistency: hard or soft  Straining: yes.  Splinting: no.  Incomplete evacuation: yes.  She Admits to accidental bowel leakage / fecal incontinence  Occurs: after most bowel movements  Consistency with leakage: liquid Bowel regimen: none Last colonoscopy: Date 2013, Results: ulcerative colitis/ proctitis Uses mesalamine suppositories for UC, used to be on Enbrel.   Sexual Function Sexually active: yes.  Sexual orientation:  heterosexual Pain with sex: Yes, deep in the pelvis, has discomfort due to dryness  Pelvic Pain Denies pelvic pain  Past Medical History:   Past Medical History:  Diagnosis Date   Abnormal Pap smear    h/o LEEP 2012, neg paps since   Allergy    Anxiety    panic attacks - no meds currently   Arthritis    hands hips shoulder   Headache    HSV infection    Lupus anticoagulant disorder (HCC)    Pregnancy related carpal tunnel syndrome, antepartum 2013   Psoriatic arthritis (HCC)    Thrombocytopenia complicating pregnancy (HCC)    2017 pregnancy   Ulcerative colitis (HCC)    dx 2014, Dr. Loreta Ave     Past Surgical History:   Past Surgical History:  Procedure Laterality Date   CESAREAN SECTION  11/16/2011   Procedure: CESAREAN SECTION;  Surgeon: Jeani Hawking, MD;  Location: [facility];  Service: Gynecology;  Laterality: N/A;   CESAREAN SECTION N/A 10/05/2015   Procedure: CESAREAN SECTION REPEAT;  Surgeon: Zelphia Cairo, MD;  Location: [facility];  Service: Obstetrics;  Laterality: N/A;  [facility] T to [facility] - confirmed edc 10/12/15   LEEP     MOUTH SURGERY     WISDOM TOOTH EXTRACTION       Past OB/GYN History: OB History  Gravida Para Term Preterm AB Living  2 2 2  0 0 2  SAB IAB Ectopic Multiple Live Births  0 0 0 0 2    # Outcome Date GA Lbr Len/2nd Weight Sex Delivery Anes PTL Lv  2 Term 10/05/15 [redacted]w[redacted]d  9 lb 5.2 oz (  4.23 kg) M CS-LTranv Spinal  LIV  1 Term 11/16/11 [redacted]w[redacted]d  10 lb 9 oz (4.791 kg) M CS-Vac EPI  LIV     Birth Comments: WNL     Complications: Chorioamnionitis    Vaginal deliveries: 0,  Forceps/ Vacuum deliveries: 0, Cesarean section: 2 Patient's last menstrual period was 01/27/2021.  Contraception: none.   Medications: She has a current medication list which includes the following prescription(s): bupropion, cholecalciferol, claritin-d 24 hour, fluoxetine, halobetasol, lorazepam, mesalamine, nystatin-triamcinolone, triamcinolone, valacyclovir, risankizumab-rzaa, and [DISCONTINUED] norethindrone.   Allergies: Patient is allergic to latex, mesalamine, and zyrtec [cetirizine hcl].   Social  History:  Social History   Tobacco Use   Smoking status: Former    Packs/day: 0.25    Years: 4.00    Pack years: 1.00    Types: Cigarettes    Quit date: 04/24/2004    Years since quitting: 16.8   Smokeless tobacco: Never  Vaping Use   Vaping Use: Never used  Substance Use Topics   Alcohol use: Yes    Alcohol/week: 1.0 standard drink    Types: 1 Glasses of wine per week    Comment: socially   Drug use: No    Relationship status: married She lives with husband and sons.   She is employed. Regular exercise: Yes: weightlifting and cardio History of abuse: No  Family History:   Family History  Problem Relation Age of Onset   Hypertension Maternal Grandmother    Thrombophlebitis Maternal Grandmother        has had "blood clots"   Thyroid disease Maternal Grandmother    Kidney disease Maternal Grandmother    Macular degeneration Maternal Grandmother    Heart disease Father    Arthritis Father    Lymphoma Father    Colitis Brother    Diabetes Cousin      Review of Systems: Review of Systems  Constitutional:  Positive for malaise/fatigue. Negative for fever and weight loss.  Respiratory:  Negative for cough, shortness of breath and wheezing.   Cardiovascular:  Negative for chest pain, palpitations and leg swelling.  Gastrointestinal:  Negative for abdominal pain and blood in stool.  Genitourinary:  Negative for dysuria.       + vaginal discharge and abnormal periods  Musculoskeletal:  Negative for myalgias.  Skin:  Positive for rash.  Neurological:  Negative for dizziness and headaches.  Endo/Heme/Allergies:  Does not bruise/bleed easily.       + hot flashes  Psychiatric/Behavioral:  Negative for depression. The patient is not nervous/anxious.     OBJECTIVE Physical Exam: Vitals:   02/18/21 0845  BP: 118/78  Pulse: 80  Weight: 149 lb (67.6 kg)  Height: 5\' 5"  (1.651 m)    Physical Exam Constitutional:      General: She is not in acute distress. Pulmonary:      Effort: Pulmonary effort is normal.  Abdominal:     General: There is no distension.     Palpations: Abdomen is soft.     Tenderness: There is no abdominal tenderness. There is no rebound.  Musculoskeletal:        General: No swelling. Normal range of motion.  Skin:    General: Skin is warm and dry.     Findings: No rash.  Neurological:     Mental Status: She is alert and oriented to person, place, and time.  Psychiatric:        Mood and Affect: Mood normal.        Behavior:  Behavior normal.     GU / Detailed Urogynecologic Evaluation:  Pelvic Exam: Normal external female genitalia; Bartholin's and Skene's glands normal in appearance; urethral meatus normal in appearance, no urethral masses or discharge.   CST: negative  Speculum exam reveals normal vaginal mucosa without atrophy. Cervix normal appearance. Uterus normal single, nontender. Adnexa no mass, fullness, tenderness.     Pelvic floor strength I/V, puborectalis III/V external anal sphincter II/V  Pelvic floor musculature: Higher tone pelvic floor muscles. Right levator non-tender, Right obturator non-tender, Left levator non-tender, Left obturator non-tender  POP-Q:   POP-Q  -3                                            Aa   -3                                           Ba  -7                                              C   2                                            Gh  3.5                                            Pb  10                                            tvl   -2                                            Ap  -2                                            Bp  -10                                              D     Rectal Exam:  Normal sphincter tone, no distal rectocele, enterocoele not present, no rectal masses, noted mild dyssynergia when asking the patient to bear down- slow to relax.  Post-Void Residual (PVR) by Bladder Scan: In order to evaluate bladder emptying, we discussed  obtaining a postvoid residual and she agreed to this procedure.  Procedure: The ultrasound unit was placed on the patient's abdomen in the suprapubic region after the patient had voided. A PVR  of 3 ml was obtained by bladder scan.  Laboratory Results: POC urine: neg   ASSESSMENT AND PLAN Ms. Mcquinn is a 40 y.o. with:  1. SUI (stress urinary incontinence, female)   2. Incontinence of feces, unspecified fecal incontinence type   3. Urinary urgency    SUI -For treatment of stress urinary incontinence,  non-surgical options include expectant management, weight loss, physical therapy, as well as a pessary.  Surgical options include a midurethral sling, and transurethral injection of a bulking agent. - She is starting physical therapy next week and would like to start with that before pursuing other treatments.   2. Fecal incontinence - Treatment options include anti-diarrhea medication (loperamide/ Imodium OTC or prescription lomotil), fiber supplements, physical therapy, and possible sacral neuromodulation or surgery.   - She will start with metamucil supplementation and PT. Also encouraged follow up with GI, as stool consistency can be affected by UC as well and she likely needs updated colonoscopy.   3. Urinary urgency - not as often as SUI  Follow up as needed   Marguerita Beards, MD   Medical Decision Making:  - Reviewed/ ordered a clinical laboratory test - Review and summation of prior records

## 2021-02-18 ENCOUNTER — Other Ambulatory Visit: Payer: Self-pay

## 2021-02-18 ENCOUNTER — Encounter: Payer: Self-pay | Admitting: Obstetrics and Gynecology

## 2021-02-18 ENCOUNTER — Ambulatory Visit (INDEPENDENT_AMBULATORY_CARE_PROVIDER_SITE_OTHER): Payer: 59 | Admitting: Obstetrics and Gynecology

## 2021-02-18 VITALS — BP 118/78 | HR 80 | Ht 65.0 in | Wt 149.0 lb

## 2021-02-18 DIAGNOSIS — R159 Full incontinence of feces: Secondary | ICD-10-CM | POA: Diagnosis not present

## 2021-02-18 DIAGNOSIS — N393 Stress incontinence (female) (male): Secondary | ICD-10-CM

## 2021-02-18 DIAGNOSIS — R3915 Urgency of urination: Secondary | ICD-10-CM | POA: Diagnosis not present

## 2021-02-18 LAB — POCT URINALYSIS DIPSTICK
Appearance: NORMAL
Bilirubin, UA: NEGATIVE
Blood, UA: NEGATIVE
Glucose, UA: NEGATIVE
Ketones, UA: NEGATIVE
Leukocytes, UA: NEGATIVE
Nitrite, UA: NEGATIVE
Protein, UA: NEGATIVE
Spec Grav, UA: 1.01 (ref 1.010–1.025)
Urobilinogen, UA: 0.2 E.U./dL
pH, UA: 7 (ref 5.0–8.0)

## 2021-02-18 NOTE — Patient Instructions (Signed)
Accidental Bowel Leakage: Our goal is to achieve formed bowel movements daily or every-other-day without leakage.  You may need to try different combinations of the following options to find what works best for you.  Some management options include: Dietary changes (more leafy greens, vegetables and fruits; less processed foods) Fiber supplementation (Metamucil or something with psyllium as active ingredient) Over-the-counter imodium (tablets or liquid) to help solidify the stool and prevent leakage of stool. If you get constipated you can use Miralax as needed to achieve bowel movements.  

## 2021-02-22 ENCOUNTER — Other Ambulatory Visit (HOSPITAL_BASED_OUTPATIENT_CLINIC_OR_DEPARTMENT_OTHER): Payer: Self-pay | Admitting: Obstetrics & Gynecology

## 2021-02-22 ENCOUNTER — Other Ambulatory Visit: Payer: Self-pay

## 2021-02-22 ENCOUNTER — Other Ambulatory Visit (HOSPITAL_BASED_OUTPATIENT_CLINIC_OR_DEPARTMENT_OTHER): Payer: 59

## 2021-02-22 DIAGNOSIS — N920 Excessive and frequent menstruation with regular cycle: Secondary | ICD-10-CM

## 2021-02-23 LAB — FOLLICLE STIMULATING HORMONE: FSH: 12.3 m[IU]/mL

## 2021-02-23 LAB — ESTRADIOL: Estradiol: 40.1 pg/mL

## 2021-02-25 ENCOUNTER — Ambulatory Visit: Payer: 59 | Admitting: Physical Therapy

## 2021-03-22 ENCOUNTER — Ambulatory Visit: Payer: 59 | Admitting: Physical Therapy

## 2021-05-03 ENCOUNTER — Other Ambulatory Visit: Payer: Self-pay

## 2021-05-03 ENCOUNTER — Ambulatory Visit: Payer: 59 | Attending: Obstetrics & Gynecology | Admitting: Physical Therapy

## 2021-05-03 ENCOUNTER — Encounter: Payer: Self-pay | Admitting: Physical Therapy

## 2021-05-03 DIAGNOSIS — R293 Abnormal posture: Secondary | ICD-10-CM | POA: Diagnosis not present

## 2021-05-03 DIAGNOSIS — N393 Stress incontinence (female) (male): Secondary | ICD-10-CM | POA: Diagnosis present

## 2021-05-03 DIAGNOSIS — M6281 Muscle weakness (generalized): Secondary | ICD-10-CM | POA: Diagnosis not present

## 2021-05-03 DIAGNOSIS — R279 Unspecified lack of coordination: Secondary | ICD-10-CM | POA: Diagnosis not present

## 2021-05-03 NOTE — Therapy (Signed)
[facility] @ [facility] [address] [place], [place], [address] Phone: [phone]   Fax:  [phone]  Physical Therapy Evaluation  Patient Details  Name: Ariel Pierce MRN: [id number] Date of Birth: Jun 20, 1980 Referring Provider (PT): Jerene Bears, MD   Encounter Date: 05/03/2021   PT End of Session - 05/03/21 0857     Visit Number 1    Date for PT Re-Evaluation 08/01/21    Authorization Type UHC    Authorization - Number of Visits 60    PT Start Time 0800    PT Stop Time 0845    PT Time Calculation (min) 45 min    Activity Tolerance No increased pain;Patient tolerated treatment well    Behavior During Therapy [facility] for tasks assessed/performed             Past Medical History:  Diagnosis Date   Abnormal Pap smear    h/o LEEP 2012, neg paps since   Allergy    Anxiety    panic attacks - no meds currently   Arthritis    hands hips shoulder   Headache    HSV infection    Lupus anticoagulant disorder (HCC)    Pregnancy related carpal tunnel syndrome, antepartum 2013   Psoriatic arthritis (HCC)    Thrombocytopenia complicating pregnancy (HCC)    2017 pregnancy   Ulcerative colitis (HCC)    dx 2014, Dr. Loreta Ave    Past Surgical History:  Procedure Laterality Date   CESAREAN SECTION  11/16/2011   Procedure: CESAREAN SECTION;  Surgeon: Jeani Hawking, MD;  Location: [facility];  Service: Gynecology;  Laterality: N/A;   CESAREAN SECTION N/A 10/05/2015   Procedure: CESAREAN SECTION REPEAT;  Surgeon: Zelphia Cairo, MD;  Location: [facility];  Service: Obstetrics;  Laterality: N/A;  [facility] T to RNFA - confirmed edc 10/12/15   LEEP     MOUTH SURGERY     WISDOM TOOTH EXTRACTION      There were no vitals filed for this visit.    Subjective Assessment - 05/03/21 0803     Subjective Pt reports she has had urinary incontinence since having second child 5 year ago and has progressively gotten worse to now have  smearing intermittently with stressors as well. Usually stressors including running, sneezing, laughing, coughing, working out.    Pertinent History 2 c-sections 41yo, 41yo    How long can you sit comfortably? no limits    How long can you stand comfortably? no limts    How long can you walk comfortably? no limits    Patient Stated Goals to have less leakage    Currently in Pain? No/denies                [facility] PT Assessment - 05/03/21 0001       Assessment   Medical Diagnosis N39.3 (ICD-10-CM) - Stress incontinence    Referring Provider (PT) Jerene Bears, MD    Onset Date/Surgical Date --   at least 5 years   Prior Therapy not for PFPT, with have PT for a knee prior      Precautions   Precautions None      Restrictions   Weight Bearing Restrictions No      Balance Screen   Has the patient fallen in the past 6 months No    Has the patient had a decrease in activity level because of a fear of falling?  No    Is the  patient reluctant to leave their home because of a fear of falling?  No      Home Ecologist residence    Living Arrangements Spouse/significant other;Children      Prior Function   Level of Independence Independent    Vocation Full time employment    Vocation Requirements accounting      Cognition   Overall Cognitive Status Within Functional Limits for tasks assessed      Sensation   Light Touch Appears Intact   does report a small patch of skin on Rt lateral thigh anteriorly that has been numb since kids     Coordination   Gross Motor Movements are Fluid and Coordinated Yes    Fine Motor Movements are Fluid and Coordinated Yes      Posture/Postural Control   Posture/Postural Control Postural limitations    Postural Limitations Rounded Shoulders;Posterior pelvic tilt      ROM / Strength   AROM / PROM / Strength AROM;Strength      AROM   Overall AROM Comments WFL      Strength   Overall Strength Comments 4+/5  throughout bil hips      Flexibility   Soft Tissue Assessment /Muscle Length yes   bil hamstrings and adductors limited by 25%     Palpation   Palpation comment no TTP                        Objective measurements completed on examination: See above findings.     Pelvic Floor Special Questions - 05/03/21 0001     Prior Pelvic/Prostate Exam Yes   has one abnormal PAP but since has been normal   Are you Pregnant or attempting pregnancy? No    Prior Pregnancies Yes    Number of Pregnancies 2    Number of C-Sections 2    Any difficulty with labor and deliveries Yes   first was an emergency C-section with infection postpartum   Diastasis Recti yes- 1 finger width above umbilicus however good density noted    Currently Sexually Active Yes    Is this Painful Yes   sometimes with dryness   History of sexually transmitted disease Yes   Herpes -1 (cold sores)   Marinoff Scale no problems    Urinary Leakage Yes    How often daily    Pad use 1-2 panty liners per day    Activities that cause leaking Coughing;Sneezing;Laughing;Running;Exercising    Urinary urgency Yes   when has stress incontinence with voiding fully after this. Pt reports when urinates sometimes she feels like she doesn't empty and has some dripping that she can't stop   Urinary frequency no    Fecal incontinence Yes   smearing intermittently and notices when wiping some stool without having a BM. Has BMs every few days (2-3days), type 1, 4,5)   Fluid intake no - tries to but drinks 40-60 oz per day    Caffeine beverages yes- 2-4 cups coffee    Falling out feeling (prolapse) No    External Perineal Exam WFL    Prolapse Anterior Wall   grade 1 in hooklying   Pelvic Floor Internal Exam patient identified and patient confirms consent for PT to perform internal soft tissue work and muscle strength and integrity assessmen    Exam Type Vaginal    Sensation WFL    Palpation no TTP    Strength good squeeze, good  lift,  able to hold agaisnt strong resistance    Strength # of reps 4    Strength # of seconds 3    Tone fair                       PT Education - 05/03/21 0856     Education Details Pt educated on exam findings, POC, HEP, bladder irritants, vaginal lubricants    Person(s) Educated Patient    Methods Explanation;Demonstration;Tactile cues;Verbal cues;Handout    Comprehension Verbalized understanding;Returned demonstration              PT Short Term Goals - 05/03/21 1014       PT SHORT TERM GOAL #1   Title Pt to be I with HEP    Time 5    Period Weeks    Status New    Target Date 06/07/21      PT SHORT TERM GOAL #2   Title pt to report no more than 1 leak of urine per day for improvement of symptoms and need of one panty liner per day.    Time 5    Period Weeks    Status New    Target Date 06/07/21      PT SHORT TERM GOAL #3   Title pt to demonstrate improvement with coordination for core and pelvic floor with breathing mechanics for all exericses at least 50% of the time to decrease strain at pelvic floor and leakage symptoms.    Time 5    Period Weeks    Status New    Target Date 06/07/21               PT Long Term Goals - 05/03/21 1016       PT LONG TERM GOAL #1   Title Pt to be I with HEP      PT LONG TERM GOAL #2   Title pt to report no more than 1 leak of urine per week for improvement of symptoms and need of one panty liner per day.    Time 3    Period Months    Status New    Target Date 08/01/21      PT LONG TERM GOAL #3   Title pt to demonstrate improvement with coordination for core and pelvic floor with breathing mechanics for all exericses at least 75% of the time to decrease strain at pelvic floor and leakage symptoms.    Time 3    Period Months    Status New    Target Date 08/01/21      PT LONG TERM GOAL #4   Title pt to report no fecal leakage noted for at least 2 weeks with good recall of voiding mechanics and  breathing mechanics to decrease strain and fecal leakage.    Time 3    Period Months    Status New    Target Date 08/01/21                    Plan - 05/03/21 [id number]     Clinical Impression Statement Pt is 41yo female presenting to clinic with progressive worsening of urinary and fecal incontinence since birth of second child 5 years ago. Pt reports urinary leakage started first, now notices stool when wiping intermittently though didn't have a BM. Pt has urinary leakage with stressors of running, exercising with weight lifting, sneezing, laughing, and coughing. Pt uses 1-2 panty liners per day for urinary leakage. Pt  has BM every 2-3 days with varied stool types from 1,4,5 and sometimes needs to strain. Pt found to have very mild bil hip weakness 4+/5 throughout, and functional mobility in spine and bil hips. Pt had no TTP externally. Pt had one finger width seperatation at DRA above umbilicus but good density and resistance felt. Pt consented to internal vaginal assessment of pelvic floor this date, found to have 4/5 strength with one-two reps however unable to hold this for longer than 3 seconds. Pt would benefit from additional PT for further addressing of deficits found at eval.    Personal Factors and Comorbidities Comorbidity 2;Fitness;Time since onset of injury/illness/exacerbation    Comorbidities x2 c-sections with larger babies per pt (10# and 9#)    Examination-Activity Limitations Continence;Toileting;Other   running, working out   [employer] Activity    Stability/Clinical Decision Making Stable/Uncomplicated    Clinical Decision Making Low    Rehab Potential Good    PT Frequency 1x / week    PT Duration Other (comment)   10 visits   PT Treatment/Interventions ADLs/Self Care Home Management;Functional mobility training;Therapeutic activities;Therapeutic exercise;Neuromuscular re-education;Manual techniques;Patient/family education;Passive  range of motion;Energy conservation    PT Next Visit Plan coordination of breathing/core/PF, voiding mechanics    PT Home Exercise Plan [id number]    Consulted and Agree with Plan of Care Patient             Patient will benefit from skilled therapeutic intervention in order to improve the following deficits and impairments:  Postural dysfunction, Decreased strength, Decreased mobility, Impaired flexibility, Improper body mechanics, Decreased endurance, Decreased coordination  Visit Diagnosis: Muscle weakness (generalized) - Plan: PT plan of care cert/re-cert  Lack of coordination - Plan: PT plan of care cert/re-cert  Abnormal posture - Plan: PT plan of care cert/re-cert  Stress incontinence (female) (female) - Plan: PT plan of care cert/re-cert     Problem List Patient Active Problem List   Diagnosis Date Noted   GAD (generalized anxiety disorder) 02/06/2018   Lupus anticoagulant positive 09/28/2016   S/P cesarean section 10/05/2015   Viral meningitis 01/07/2015   Psoriatic arthritis ([place]) 01/07/2013   Psoriasis 12/26/2012   Ulcerative colitis ([place]) 12/26/2012    No emotional/communication barriers or cognitive limitation. Patient is motivated to learn. Patient understands and agrees with treatment goals and plan. PT explains patient will be examined in standing, sitting, and lying down to see how their muscles and joints work. When they are ready, they will be asked to remove their underwear so PT can examine their perineum. The patient is also given the option of providing their own chaperone as one is not provided in our facility. The patient also has the right and is explained the right to defer or refuse any part of the evaluation or treatment including the internal exam. With the patient's consent, PT will use one gloved finger to gently assess the muscles of the pelvic floor, seeing how well it contracts and relaxes and if there is muscle symmetry. After, the patient will get  dressed and PT and patient will discuss exam findings and plan of care. PT and patient discuss plan of care, schedule, attendance policy and HEP activities.   Stacy Gardner, PT, DPT 05/03/2308:18 AM   [facility] @ [place], [place], [address] Phone: [phone]   Fax:  [phone]  Name: Ariel Pierce MRN: [id number] Date of Birth: 02-08-1981

## 2021-05-03 NOTE — Patient Instructions (Signed)
Bladder Irritants  Certain foods and beverages can be irritating to the bladder.  Avoiding these irritants may decrease your symptoms of urinary urgency, frequency or bladder pain.  Even reducing your intake can help with your symptoms.  Not everyone is sensitive to all bladder irritants, so you may consider focusing on one irritant at a time, removing or reducing your intake of that irritant for 7-10 days to see if this change helps your symptoms.  Water intake is also very important.  Below is a list of bladder irritants.  Drinks: alcohol, carbonated beverages, caffeinated beverages such as coffee and tea, drinks with artificial sweeteners, citrus juices, apple juice, tomato juice  Foods: tomatoes and tomato based foods, spicy food, sugar and artificial sweeteners, vinegar, chocolate, raw onion, apples, citrus fruits, pineapple, cranberries, tomatoes, strawberries, plums, peaches, cantaloupe  Other: acidic urine (too concentrated) - see water intake info below  Substitutes you can try that are NOT irritating to the bladder: cooked onion, pears, papayas, sun-brewed decaf teas, watermelons, non-citrus herbal teas, apricots, kava and low-acid instant drinks (Postum).    WATER INTAKE: Remember to drink lots of water (aim for fluid intake of half your body weight with 2/3 of fluids being water).  You may be limiting fluids due to fear of leakage, but this can actually worsen urgency symptoms due to highly concentrated urine.  Water helps balance the pH of your urine so it doesn't become too acidic - acidic urine is a bladder irritant!   Urge Incontinence  Ideal urination frequency is every 2-4 wakeful hours, which equates to 5-8 times within a 24-hour period.   Urge incontinence is leakage that occurs when the bladder muscle contracts, creating a sudden need to go before getting to the bathroom.   Going too often when your bladder isn't actually full can disrupt the body's automatic signals to  store and hold urine longer, which will increase urgency/frequency.  In this case, the bladder is running the show and strategies can be learned to retrain this pattern.   One should be able to control the first urge to urinate, at around 169mL.  The bladder can hold up to a grande latte, or 430mL. To help you gain control, practice the Urge Drill below when urgency strikes.  This drill will help retrain your bladder signals and allow you to store and hold urine longer.  The overall goal is to stretch out your time between voids to reach a more manageable voiding schedule.    Practice your "quick flicks" often throughout the day (each waking hour) even when you don't need feel the urge to go.  This will help strengthen your pelvic floor muscles, making them more effective in controlling leakage.  Urge Drill  When you feel an urge to go, follow these steps to regain control: Stop what you are doing and be still Take one deep breath, directing your air into your abdomen Think an affirming thought, such as I've got this. Do 5 quick flicks of your pelvic floor Walk with control to the bathroom to void, or delay voiding    Lubrication Used for intercourse to reduce friction Avoid ones that have glycerin, warming gels, tingling gels, icing or cooling gel, scented Avoid parabens due to a preservative similar to female sex hormone May need to be reapplied once or several times during sexual activity Can be applied to both partners genitals prior to vaginal penetration to minimize friction or irritation Prevent irritation and mucosal tears that cause post coital  pain and increased the risk of vaginal and urinary tract infections Oil-based lubricants cannot be used with condoms due to breaking them down.  Least likely to irritate vaginal tissue.  Plant based-lubes are safe Silicone-based lubrication are thicker and last long and used for post-menopausal women  Vaginal Lubricators Here is a  list of some suggested lubricators you can use for intercourse. Use the most hypoallergenic product.  You can place on you or your partner.  Slippery Stuff ( water based) Sylk or Sliquid Natural H2O ( good  if frequent UTIs)- [employer]mart, amazon Sliquid organics silk-(aloe and silicone based ) [employer] (www.blossom-organics.com)- (aloe based ) Coconut oil, olive oil -not good with condoms  PJur Woman Nude- (water based) amazon Uberlube- ( silicon) [place] has an organic one Yes lubricant- (water based and has plant oil based similar to silicone) [employer] Platinum-Silicone, [employer], [facility] Olive and Bee intimate cream-  www.oliveandbee.com.au Pink - [employer] stuff Erosense Sync- walmart, amazon Coconu- FailLinks.co.uk  Things to avoid in lubricants are glycerin, warming gels, tingling gels, icing or cooling  gels, and scented gels.  Also avoid Vaseline. KY jelly, Replens, and Astroglide contain chlorhexidine which kills good bacteria(lactobacilli)  Things to avoid in the vaginal area Do not use things to irritate the vulvar area No lotions- see below Soaps you  can use :Aveeno, [place], Good Clean Love cleanser if needed. Must be gentle No deodorants No douches Good to sleep without underwear to let the vaginal area to air out No scrubbing: spread the lips to let warm water rinse over labias and pat dry  Creams that can be used on the Vulva Area V [employer], [employer] Vital V Wild [place] Salve Julva- [employer] Botanical Pro-Meno Wild Yam Cream Coconut oil, olive oil Cleo by [employer] labial moisturizer -[place],  [place] Releveum ( lidocaine) or Desert [employer] Yes Moisturizer

## 2021-05-20 ENCOUNTER — Ambulatory Visit: Payer: 59 | Admitting: Physical Therapy

## 2021-05-20 ENCOUNTER — Encounter: Payer: Self-pay | Admitting: Physical Therapy

## 2021-05-20 ENCOUNTER — Other Ambulatory Visit: Payer: Self-pay

## 2021-05-20 DIAGNOSIS — R279 Unspecified lack of coordination: Secondary | ICD-10-CM

## 2021-05-20 DIAGNOSIS — M6281 Muscle weakness (generalized): Secondary | ICD-10-CM | POA: Diagnosis not present

## 2021-05-20 NOTE — Therapy (Signed)
[facility] @ [place], [place], [address] Phone: [phone]   Fax:  [phone]  Physical Therapy Treatment  Patient Details  Name: Ariel Pierce MRN: [id number] Date of Birth: [birth year] Referring Provider (PT): Megan Salon, MD   Encounter Date: 05/20/2021   PT End of Session - 05/20/21 0931     Visit Number 2    Date for PT Re-Evaluation 08/01/21    Authorization Type UHC    Authorization - Number of Visits 60    PT Start Time 0846    PT Stop Time 0930    PT Time Calculation (min) 44 min    Activity Tolerance No increased pain;Patient tolerated treatment well    Behavior During Therapy [facility] for tasks assessed/performed             Past Medical History:  Diagnosis Date   Abnormal Pap smear    h/o LEEP 2012, neg paps since   Allergy    Anxiety    panic attacks - no meds currently   Arthritis    hands hips shoulder   Headache    HSV infection    Lupus anticoagulant disorder ([place])    Pregnancy related carpal tunnel syndrome, antepartum 2013   Psoriatic arthritis ([place])    Thrombocytopenia complicating pregnancy ([place])    2017 pregnancy   Ulcerative colitis ([place])    dx 2014, Dr. Collene Mares    Past Surgical History:  Procedure Laterality Date   CESAREAN SECTION  11/16/2011   Procedure: CESAREAN SECTION;  Surgeon: Cyril Mourning, MD;  Location: [facility];  Service: Gynecology;  Laterality: N/A;   CESAREAN SECTION N/A 10/05/2015   Procedure: CESAREAN SECTION REPEAT;  Surgeon: Marylynn Pearson, MD;  Location: [facility];  Service: Obstetrics;  Laterality: N/A;  Linus Orn T to RNFA - confirmed edc 10/12/15   LEEP     MOUTH SURGERY     WISDOM TOOTH EXTRACTION      There were no vitals filed for this visit.   Subjective Assessment - 05/20/21 0851     Subjective Pt reports she is having about the same leakage at eval with minimal change at this time. Has been attempting to contract PF with  working out but unable to coordinate with mobility and breathing.    Pertinent History 2 c-sections 41yo, 41yo    How long can you sit comfortably? no limits    How long can you stand comfortably? no limts    How long can you walk comfortably? no limits    Patient Stated Goals to have less leakage    Currently in Pain? No/denies                               [facility] Adult PT Treatment/Exercise - 05/20/21 0001       Self-Care   Self-Care Other Self-Care Comments    Other Self-Care Comments  PT also educated on vaginal weights as she was requesting information on improved feedback for pelvic floor contractions and holds at home, also given education on techniques for this without weights such as with vaginal penetration during intercourse or with digit to feel for contraction, or placing hand out outside of vaginal opening to self assess as well.      Exercises   Exercises Lumbar;Knee/Hip      Lumbar Exercises: Standing   Functional Squats 20 reps    Functional Squats  Limitations 10# kettle bell    Other Standing Lumbar Exercises palloff blue band x10; rotation blue band x10 each way      Lumbar Exercises: Supine   Clam 20 reps    Clam Limitations black loop with exhale    Bridge 20 reps    Bridge Limitations 2x10 with exhale    Other Supine Lumbar Exercises opp arm/knee press and hold 3s with exhale 2x10    Other Supine Lumbar Exercises TA activations 2x10 in hooklying with VC and TC for activation and technique, initially demonstrated lower abdminal bulge but improved with cues for balloon breathing technique                     PT Education - 05/20/21 0930     Education Details Pt educated on HEP, coorindation of core activation and breathing technique with exercises    Person(s) Educated Patient    Methods Explanation;Demonstration;Tactile cues;Verbal cues;Handout    Comprehension Returned demonstration;Verbalized understanding               PT Short Term Goals - 05/03/21 1014       PT SHORT TERM GOAL #1   Title Pt to be I with HEP    Time 5    Period Weeks    Status New    Target Date 06/07/21      PT SHORT TERM GOAL #2   Title pt to report no more than 1 leak of urine per day for improvement of symptoms and need of one panty liner per day.    Time 5    Period Weeks    Status New    Target Date 06/07/21      PT SHORT TERM GOAL #3   Title pt to demonstrate improvement with coordination for core and pelvic floor with breathing mechanics for all exericses at least 50% of the time to decrease strain at pelvic floor and leakage symptoms.    Time 5    Period Weeks    Status New    Target Date 06/07/21               PT Long Term Goals - 05/03/21 1016       PT LONG TERM GOAL #1   Title Pt to be I with HEP      PT LONG TERM GOAL #2   Title pt to report no more than 1 leak of urine per week for improvement of symptoms and need of one panty liner per day.    Time 3    Period Months    Status New    Target Date 08/01/21      PT LONG TERM GOAL #3   Title pt to demonstrate improvement with coordination for core and pelvic floor with breathing mechanics for all exericses at least 75% of the time to decrease strain at pelvic floor and leakage symptoms.    Time 3    Period Months    Status New    Target Date 08/01/21      PT LONG TERM GOAL #4   Title pt to report no fecal leakage noted for at least 2 weeks with good recall of voiding mechanics and breathing mechanics to decrease strain and fecal leakage.    Time 3    Period Months    Status New    Target Date 08/01/21  Plan - 05/20/21 0932     Clinical Impression Statement Pt presents to clinic reporting she has had similar symptoms since eval with urinary leakage and has been trying to contract pelvic floor with exericses but doesn't know if she is doing this correctly. Pt session focused on coordination of breathing mechanics with  core activation with extra time spent on cuing for improved TA activation with isolation and with exercises. Pt tolerated well and demonstrated improved ability to contract core and coordinate with breathing for functional strengthening during session. Pt given HEP and went over this session and pt denied additional questions and reported better understanding of pattern for this at home. Pt would benefit from additional PT for further addressing of deficits found at eval.    Personal Factors and Comorbidities Comorbidity 2;Fitness;Time since onset of injury/illness/exacerbation    Comorbidities x2 c-sections with larger babies per pt (10# and 9#)    Examination-Activity Limitations Continence;Toileting;Other   running, working out   [employer] Activity    Stability/Clinical Decision Making Stable/Uncomplicated    Rehab Potential Good    PT Frequency 1x / week    PT Duration Other (comment)   10 visits   PT Treatment/Interventions ADLs/Self Care Home Management;Functional mobility training;Therapeutic activities;Therapeutic exercise;Neuromuscular re-education;Manual techniques;Patient/family education;Passive range of motion;Energy conservation    PT Next Visit Plan coordination of breathing/core/PF, voiding mechanics    PT Home Exercise Plan [id number]    Consulted and Agree with Plan of Care Patient             Patient will benefit from skilled therapeutic intervention in order to improve the following deficits and impairments:  Postural dysfunction, Decreased strength, Decreased mobility, Impaired flexibility, Improper body mechanics, Decreased endurance, Decreased coordination  Visit Diagnosis: Muscle weakness (generalized)  Lack of coordination     Problem List Patient Active Problem List   Diagnosis Date Noted   GAD (generalized anxiety disorder) 02/06/2018   Lupus anticoagulant positive 09/28/2016   S/P cesarean section 10/05/2015   Viral  meningitis 01/07/2015   Psoriatic arthritis ([place]) 01/07/2013   Psoriasis 12/26/2012   Ulcerative colitis ([place]) 12/26/2012    Stacy Gardner, PT, DPT 01/27/239:36 AM   [facility] @ [place], [place], [address] Phone: [phone]   Fax:  [phone]  Name: Ariel Pierce MRN: [id number] Date of Birth: 07/24/1980

## 2021-06-02 ENCOUNTER — Other Ambulatory Visit: Payer: Self-pay

## 2021-06-02 ENCOUNTER — Ambulatory Visit: Payer: 59 | Attending: Obstetrics & Gynecology | Admitting: Physical Therapy

## 2021-06-02 DIAGNOSIS — M6281 Muscle weakness (generalized): Secondary | ICD-10-CM | POA: Insufficient documentation

## 2021-06-02 DIAGNOSIS — R278 Other lack of coordination: Secondary | ICD-10-CM | POA: Insufficient documentation

## 2021-06-02 NOTE — Therapy (Signed)
[facility] @ [place], [place], [address] Phone: [phone]   Fax:  [phone]  Physical Therapy Treatment  Patient Details  Name: Ariel Pierce MRN: [id number] Date of Birth: [birth year] Referring Provider (PT): Megan Salon, MD   Encounter Date: 06/02/2021   PT End of Session - 06/02/21 1702     Visit Number 3    Date for PT Re-Evaluation 08/01/21    Authorization Type UHC    Authorization - Number of Visits 60    PT Start Time [id number]    PT Stop Time 1700    PT Time Calculation (min) 43 min    Activity Tolerance No increased pain;Patient tolerated treatment well    Behavior During Therapy [facility] for tasks assessed/performed             Past Medical History:  Diagnosis Date   Abnormal Pap smear    h/o LEEP 2012, neg paps since   Allergy    Anxiety    panic attacks - no meds currently   Arthritis    hands hips shoulder   Headache    HSV infection    Lupus anticoagulant disorder ([place])    Pregnancy related carpal tunnel syndrome, antepartum 2013   Psoriatic arthritis ([place])    Thrombocytopenia complicating pregnancy ([place])    2017 pregnancy   Ulcerative colitis ([place])    dx 2014, Dr. Collene Mares    Past Surgical History:  Procedure Laterality Date   CESAREAN SECTION  11/16/2011   Procedure: CESAREAN SECTION;  Surgeon: Cyril Mourning, MD;  Location: [facility];  Service: Gynecology;  Laterality: N/A;   CESAREAN SECTION N/A 10/05/2015   Procedure: CESAREAN SECTION REPEAT;  Surgeon: Marylynn Pearson, MD;  Location: [facility];  Service: Obstetrics;  Laterality: N/A;  Linus Orn T to RNFA - confirmed edc 10/12/15   LEEP     MOUTH SURGERY     WISDOM TOOTH EXTRACTION      There were no vitals filed for this visit.   Subjective Assessment - 06/02/21 1619     Subjective Pt reports she feels "the leakage has been good", has been focusing on coordinating pelvic floor and core with working out.     Pertinent History 2 c-sections 41yo, 41yo    How long can you sit comfortably? no limits    How long can you stand comfortably? no limts    How long can you walk comfortably? no limits    Patient Stated Goals to have less leakage    Currently in Pain? No/denies                               [facility] Adult PT Treatment/Exercise - 06/02/21 0001       Self-Care   Self-Care Other Self-Care Comments    Other Self-Care Comments  Pt educated on diaphragmatic breathing, HEP, and coordination pattern of pelvic floor/core/breathing mechanics; anatomy of diaphragm and pelvic floor with breathing      Neuro Re-ed    Neuro Re-ed Details  3x10 diaphragmatic breathing in standing with tactile cues and visual demonstration for improved breathing mechanics to decrease chest breathing pattern and promote improved activation of TA      Exercises   Exercises Lumbar;Knee/Hip      Lumbar Exercises: Standing   Functional Squats 20 reps    Functional Squats Limitations 10# kettle bell    Other Standing Lumbar  Exercises --    Other Standing Lumbar Exercises mario punches 6# 2x10                       PT Short Term Goals - 05/03/21 1014       PT SHORT TERM GOAL #1   Title Pt to be I with HEP    Time 5    Period Weeks    Status New    Target Date 06/07/21      PT SHORT TERM GOAL #2   Title pt to report no more than 1 leak of urine per day for improvement of symptoms and need of one panty liner per day.    Time 5    Period Weeks    Status New    Target Date 06/07/21      PT SHORT TERM GOAL #3   Title pt to demonstrate improvement with coordination for core and pelvic floor with breathing mechanics for all exericses at least 50% of the time to decrease strain at pelvic floor and leakage symptoms.    Time 5    Period Weeks    Status New    Target Date 06/07/21               PT Long Term Goals - 05/03/21 1016       PT LONG TERM GOAL #1   Title Pt to be I  with HEP      PT LONG TERM GOAL #2   Title pt to report no more than 1 leak of urine per week for improvement of symptoms and need of one panty liner per day.    Time 3    Period Months    Status New    Target Date 08/01/21      PT LONG TERM GOAL #3   Title pt to demonstrate improvement with coordination for core and pelvic floor with breathing mechanics for all exericses at least 75% of the time to decrease strain at pelvic floor and leakage symptoms.    Time 3    Period Months    Status New    Target Date 08/01/21      PT LONG TERM GOAL #4   Title pt to report no fecal leakage noted for at least 2 weeks with good recall of voiding mechanics and breathing mechanics to decrease strain and fecal leakage.    Time 3    Period Months    Status New    Target Date 08/01/21                   Plan - 06/02/21 1703     Clinical Impression Statement Pt presents to clinic reporting she has noticed less leakage overall and but still noticed with jumping, sneezing and intermittently with coughing and and exercise those less often. Pt session focused on hip and core strengthening with coordination of pelvic floor and breathing mechanics, pt benefit from extra time spent on diaphragmatic breathing and TA activation to improve coordination, HEP also went over. Pt tolerated well and reporting improved understanding of this. Pt would benefit from additional PT for further addressing of deficits found at eval.    Personal Factors and Comorbidities Comorbidity 2;Fitness;Time since onset of injury/illness/exacerbation    Comorbidities x2 c-sections with larger babies per pt (10# and 9#)    Examination-Activity Limitations Continence;Toileting;Other   running, working out   [employer] Activity    Stability/Clinical Decision Making Stable/Uncomplicated  Rehab Potential Good    PT Frequency 1x / week    PT Duration Other (comment)   10 visits   PT  Treatment/Interventions ADLs/Self Care Home Management;Functional mobility training;Therapeutic activities;Therapeutic exercise;Neuromuscular re-education;Manual techniques;Patient/family education;Passive range of motion;Energy conservation    PT Next Visit Plan coordination of breathing/core/PF, voiding mechanics    PT Home Exercise Plan [id number]    Consulted and Agree with Plan of Care Patient             Patient will benefit from skilled therapeutic intervention in order to improve the following deficits and impairments:  Postural dysfunction, Decreased strength, Decreased mobility, Impaired flexibility, Improper body mechanics, Decreased endurance, Decreased coordination  Visit Diagnosis: Other lack of coordination  Muscle weakness (generalized)     Problem List Patient Active Problem List   Diagnosis Date Noted   GAD (generalized anxiety disorder) 02/06/2018   Lupus anticoagulant positive 09/28/2016   S/P cesarean section 10/05/2015   Viral meningitis 01/07/2015   Psoriatic arthritis ([place]) 01/07/2013   Psoriasis 12/26/2012   Ulcerative colitis ([place]) 12/26/2012    Stacy Gardner, PT, DPT 02/09/235:05 PM   [facility] @ [place], [place], [address] Phone: [phone]   Fax:  [phone]  Name: SHIVANYA MANIS MRN: [id number] Date of Birth: 01-Jul-1980

## 2021-06-09 ENCOUNTER — Other Ambulatory Visit: Payer: Self-pay

## 2021-06-09 ENCOUNTER — Ambulatory Visit: Payer: 59 | Admitting: Physical Therapy

## 2021-06-09 DIAGNOSIS — R278 Other lack of coordination: Secondary | ICD-10-CM

## 2021-06-09 DIAGNOSIS — M6281 Muscle weakness (generalized): Secondary | ICD-10-CM

## 2021-06-09 NOTE — Therapy (Signed)
[facility] @ [place] [place], [place], [address] Phone: [phone]   Fax:  [phone]  Physical Therapy Treatment  Patient Details  Name: Ariel Pierce MRN: [id number] Date of Birth: 1981-03-05 Referring Provider (PT): Megan Salon, MD   Encounter Date: 06/09/2021   PT End of Session - 06/09/21 1619     Visit Number 4    Date for PT Re-Evaluation 08/01/21    Authorization Type UHC    Authorization - Number of Visits 60    PT Start Time 1616    PT Stop Time 1654    PT Time Calculation (min) 38 min    Activity Tolerance No increased pain;Patient tolerated treatment well    Behavior During Therapy [facility] for tasks assessed/performed             Past Medical History:  Diagnosis Date   Abnormal Pap smear    h/o LEEP 2012, neg paps since   Allergy    Anxiety    panic attacks - no meds currently   Arthritis    hands hips shoulder   Headache    HSV infection    Lupus anticoagulant disorder ([place])    Pregnancy related carpal tunnel syndrome, antepartum 2013   Psoriatic arthritis ([place])    Thrombocytopenia complicating pregnancy ([place])    2017 pregnancy   Ulcerative colitis ([place])    dx 2014, Dr. Collene Mares    Past Surgical History:  Procedure Laterality Date   CESAREAN SECTION  11/16/2011   Procedure: CESAREAN SECTION;  Surgeon: Cyril Mourning, MD;  Location: [facility];  Service: Gynecology;  Laterality: N/A;   CESAREAN SECTION N/A 10/05/2015   Procedure: CESAREAN SECTION REPEAT;  Surgeon: Marylynn Pearson, MD;  Location: [facility];  Service: Obstetrics;  Laterality: N/A;  Linus Orn T to RNFA - confirmed edc 10/12/15   LEEP     MOUTH SURGERY     WISDOM TOOTH EXTRACTION      There were no vitals filed for this visit.   Subjective Assessment - 06/09/21 1619     Subjective Pt reports she has been sneezing a lot with allergies and has smaller amounts overall. Pt is not having leakage other than this.     Pertinent History 2 c-sections 41yo, 41yo    How long can you sit comfortably? no limits    How long can you stand comfortably? no limts    How long can you walk comfortably? no limits    Patient Stated Goals to have less leakage    Currently in Pain? No/denies                            Pelvic Floor Special Questions - 06/09/21 0001     Pelvic Floor Internal Exam patient identified and patient confirms consent for PT to perform internal soft tissue work and muscle strength and integrity assessmen    Exam Type Vaginal    Sensation WFL    Palpation no TTP    Strength good squeeze, good lift, able to hold agaisnt strong resistance    Strength # of reps 8    Strength # of seconds 7    Tone greatly improved               [facility] Adult PT Treatment/Exercise - 06/09/21 0001       Neuro Re-ed    Neuro Re-ed Details  2x10 diaphragmatic breathing, x10  hip flexion with coordinated breathing and pelvic floor contract/relax, 2x10 squats with coordinated pelvic floor and breathing      Manual Therapy   Manual Therapy Internal Pelvic Floor    Manual therapy comments 2x10 pelvic floor contractions, [id number] quick flicks, x5 [id number] holds with minimal cues and no compensatory strategies                     PT Education - 06/09/21 1656     Education Details Pt educated on continued HEP and coordination with breathing mechanics for functional exercises to decrease strain at pelvic floor    Person(s) Educated Patient    Methods Explanation;Demonstration;Tactile cues;Verbal cues    Comprehension Returned demonstration;Verbalized understanding              PT Short Term Goals - 05/03/21 1014       PT SHORT TERM GOAL #1   Title Pt to be I with HEP    Time 5    Period Weeks    Status New    Target Date 06/07/21      PT SHORT TERM GOAL #2   Title pt to report no more than 1 leak of urine per day for improvement of symptoms and need of one panty liner per day.     Time 5    Period Weeks    Status New    Target Date 06/07/21      PT SHORT TERM GOAL #3   Title pt to demonstrate improvement with coordination for core and pelvic floor with breathing mechanics for all exericses at least 50% of the time to decrease strain at pelvic floor and leakage symptoms.    Time 5    Period Weeks    Status New    Target Date 06/07/21               PT Long Term Goals - 05/03/21 1016       PT LONG TERM GOAL #1   Title Pt to be I with HEP      PT LONG TERM GOAL #2   Title pt to report no more than 1 leak of urine per week for improvement of symptoms and need of one panty liner per day.    Time 3    Period Months    Status New    Target Date 08/01/21      PT LONG TERM GOAL #3   Title pt to demonstrate improvement with coordination for core and pelvic floor with breathing mechanics for all exericses at least 75% of the time to decrease strain at pelvic floor and leakage symptoms.    Time 3    Period Months    Status New    Target Date 08/01/21      PT LONG TERM GOAL #4   Title pt to report no fecal leakage noted for at least 2 weeks with good recall of voiding mechanics and breathing mechanics to decrease strain and fecal leakage.    Time 3    Period Months    Status New    Target Date 08/01/21                   Plan - 06/09/21 1657     Clinical Impression Statement Pt presents to clinic reporting has noticed improvement with leakage overall and having it less often now only with sneezing. Pt has been implementing pelvic floor coordinating and strengthening with her usual work outs and  thinks this is helping. Pt session focused on internal pelvic treatment with pt demonstrating improvement with coordination and endurance and overall tone improved. Pt also benefited from less cues during NMRE after internal treatment for improved coordination of pelvic floor and breathing. Pt reporting she is more able to "lift" pelvic floor after having  internal feedback to better understand this. Pt would benefit from additional PT for further addressing of deficits found at eval.    Personal Factors and Comorbidities Comorbidity 2;Fitness;Time since onset of injury/illness/exacerbation    Comorbidities x2 c-sections with larger babies per pt (10# and 9#)    Examination-Activity Limitations Continence;Toileting;Other   running, working out   [employer] Activity    Stability/Clinical Decision Making Stable/Uncomplicated    Rehab Potential Good    PT Frequency 1x / week    PT Duration Other (comment)   10 visits   PT Treatment/Interventions ADLs/Self Care Home Management;Functional mobility training;Therapeutic activities;Therapeutic exercise;Neuromuscular re-education;Manual techniques;Patient/family education;Passive range of motion;Energy conservation    PT Next Visit Plan coordination of breathing/core/PF    PT Home Exercise Plan [id number]    Consulted and Agree with Plan of Care Patient             Patient will benefit from skilled therapeutic intervention in order to improve the following deficits and impairments:  Postural dysfunction, Decreased strength, Decreased mobility, Impaired flexibility, Improper body mechanics, Decreased endurance, Decreased coordination  Visit Diagnosis: Muscle weakness (generalized)  Other lack of coordination     Problem List Patient Active Problem List   Diagnosis Date Noted   GAD (generalized anxiety disorder) 02/06/2018   Lupus anticoagulant positive 09/28/2016   S/P cesarean section 10/05/2015   Viral meningitis 01/07/2015   Psoriatic arthritis ([place]) 01/07/2013   Psoriasis 12/26/2012   Ulcerative colitis ([place]) 12/26/2012   No emotional/communication barriers or cognitive limitation. Patient is motivated to learn. Patient understands and agrees with treatment goals and plan. PT explains patient will be examined in standing, sitting, and lying down to  see how their muscles and joints work. When they are ready, they will be asked to remove their underwear so PT can examine their perineum. The patient is also given the option of providing their own chaperone as one is not provided in our facility. The patient also has the right and is explained the right to defer or refuse any part of the evaluation or treatment including the internal exam. With the patient's consent, PT will use one gloved finger to gently assess the muscles of the pelvic floor, seeing how well it contracts and relaxes and if there is muscle symmetry. After, the patient will get dressed and PT and patient will discuss exam findings and plan of care. PT and patient discuss plan of care, schedule, attendance policy and HEP activities.  Stacy Gardner, PT, DPT 02/16/235:08 PM   [facility] @ [place] [place], [place], [address] Phone: [phone]   Fax:  [phone]  Name: Ariel Pierce MRN: [id number] Date of Birth: [birth year]

## 2021-06-17 ENCOUNTER — Encounter: Payer: 59 | Admitting: Physical Therapy

## 2021-06-24 ENCOUNTER — Encounter: Payer: 59 | Admitting: Physical Therapy

## 2021-07-01 ENCOUNTER — Ambulatory Visit: Payer: 59 | Admitting: Physical Therapy

## 2021-07-08 ENCOUNTER — Ambulatory Visit: Payer: 59 | Admitting: Physical Therapy

## 2021-07-22 ENCOUNTER — Ambulatory Visit: Payer: 59 | Attending: Obstetrics & Gynecology | Admitting: Physical Therapy

## 2021-07-22 DIAGNOSIS — M6281 Muscle weakness (generalized): Secondary | ICD-10-CM | POA: Diagnosis present

## 2021-07-22 DIAGNOSIS — R293 Abnormal posture: Secondary | ICD-10-CM | POA: Diagnosis present

## 2021-07-22 NOTE — Patient Instructions (Signed)
Return to Running Criteria (from Groome et al, 2019)  Ability to perform 20-30 repetitions of each of the following without fatigue: Single calf raise (can start with fingertips on wall as needed for support) Single leg bridge Single leg sit to stand Sidelying hip abduction (can start with fingertips on wall as needed for support)   FOR INDIVIDUALS WITH PELVIC FLOOR SYMPTOMS: Ability to perform without leaking or prolapse symptoms: Walk 30 minutes Single leg balance 30 seconds Single leg squat 10 repetitions (2-3 sets) Jog in place 1 minute Forward bounding 10 repetitions Single leg hop 10 repetitions Single leg runners 10 repetitions     

## 2021-07-22 NOTE — Therapy (Signed)
[place] ?[facility] ?[address] ?[place], [place], [address] ?Phone: [phone]   Fax:  [phone] ? ?Physical Therapy Treatment ? ?Patient Details  ?Name: Ariel Pierce ?MRN: [id number] ?Date of Birth: 06-Sep-1980 ?Referring Provider (PT): Jerene BearsMiller, Mary S, MD ? ? ?Encounter Date: 07/22/2021 ? ? PT End of Session - 07/22/21 0850   ? ? Visit Number 5   ? Date for PT Re-Evaluation 08/01/21   ? Authorization Type UHC   ? Authorization - Number of Visits 60   ? PT Start Time 0845   ? PT Stop Time [phone]   ? PT Time Calculation (min) 43 min   ? Activity Tolerance No increased pain;Patient tolerated treatment well   ? Behavior [facility] for tasks assessed/performed   ? ?  ?  ? ?  ? ? ?Past Medical History:  ?Diagnosis Date  ? Abnormal Pap smear   ? h/o LEEP 2012, neg paps since  ? Allergy   ? Anxiety   ? panic attacks - no meds currently  ? Arthritis   ? hands hips shoulder  ? Headache   ? HSV infection   ? Lupus anticoagulant disorder (HCC)   ? Pregnancy related carpal tunnel syndrome, antepartum 2013  ? Psoriatic arthritis (HCC)   ? Thrombocytopenia complicating pregnancy (HCC)   ? 2017 pregnancy  ? Ulcerative colitis (HCC)   ? dx 2014, Dr. Loreta AveMann  ? ? ?Past Surgical History:  ?Procedure Laterality Date  ? CESAREAN SECTION  11/16/2011  ? Procedure: CESAREAN SECTION;  Surgeon: Jeani HawkingMichelle L Grewal, MD;  Location: [facility];  Service: Gynecology;  Laterality: N/A;  ? CESAREAN SECTION N/A 10/05/2015  ? Procedure: CESAREAN SECTION REPEAT;  Surgeon: Zelphia CairoGretchen Adkins, MD;  [facility];  Service: Obstetrics;  Laterality: N/A;  Kennith Centerracey T to RNFA - confirmed ?edc 10/12/15  ? LEEP    ? MOUTH SURGERY    ? WISDOM TOOTH EXTRACTION    ? ? ?There were no vitals filed for this visit. ? ? Subjective Assessment - 07/22/21 0853   ? ? Subjective Pt reports she has been having a slight increase in leakage with sneezing, running but still small amounts, 2 pads per day. only 0-1  times per night.   ? Pertinent History 2 c-sections 41yo, 41yo   ? How long can you sit comfortably? no limits   ? How long can you stand comfortably? no limts   ? How long can you walk comfortably? no limits   ? Patient Stated Goals to have less leakage   ? Currently in Pain? No/denies   ? ?  ?  ? ?  ? ? ? ? ? ? ? ? ? ? ? ? ? ? ? ? ? ? ? ? [facility] Adult PT Treatment/Exercise - 07/22/21 0001   ? ?  ? Exercises  ? Exercises Lumbar;Knee/Hip   ?  ? Lumbar Exercises: Standing  ? Functional Squats 20 reps   ? Functional Squats Limitations 20# kettle bell   ? Other Standing Lumbar Exercises palloffs blue band 2x10; rotational palloffs x210 blue band   ?  ? Lumbar Exercises: Supine  ? Other Supine Lumbar Exercises opp arm/knee press 2x10 with ball   ?  ? Lumbar Exercises: Sidelying  ? Other Sidelying Lumbar Exercises hip abduction with ball press 2x10   ?  ? Lumbar Exercises: Quadruped  ? Other Quadruped Lumbar Exercises knee hovers with ball press x10 each arm   ? ?  ?  ? ?  ? ? ? ? ? ? ? ? ? ?  PT Education - 07/22/21 0931   ? ? Education Details Pt educated on continued HEP, updated today and discussed with pt and return to running handout given   ? Person(s) Educated Patient   ? Methods Explanation;Demonstration;Tactile cues;Verbal cues;Handout   ? Comprehension Returned demonstration;Verbalized understanding   ? ?  ?  ? ?  ? ? ? PT Short Term Goals - 05/03/21 1014   ? ?  ? PT SHORT TERM GOAL #1  ? Title Pt to be I with HEP   ? Time 5   ? Period Weeks   ? Status New   ? Target Date 06/07/21   ?  ? PT SHORT TERM GOAL #2  ? Title pt to report no more than 1 leak of urine per day for improvement of symptoms and need of one panty liner per day.   ? Time 5   ? Period Weeks   ? Status New   ? Target Date 06/07/21   ?  ? PT SHORT TERM GOAL #3  ? Title pt to demonstrate improvement with coordination for core and pelvic floor with breathing mechanics for all exericses at least 50% of the time to decrease strain at pelvic floor and  leakage symptoms.   ? Time 5   ? Period Weeks   ? Status New   ? Target Date 06/07/21   ? ?  ?  ? ?  ? ? ? ? PT Long Term Goals - 05/03/21 1016   ? ?  ? PT LONG TERM GOAL #1  ? Title Pt to be I with HEP   ?  ? PT LONG TERM GOAL #2  ? Title pt to report no more than 1 leak of urine per week for improvement of symptoms and need of one panty liner per day.   ? Time 3   ? Period Months   ? Status New   ? Target Date 08/01/21   ?  ? PT LONG TERM GOAL #3  ? Title pt to demonstrate improvement with coordination for core and pelvic floor with breathing mechanics for all exericses at least 75% of the time to decrease strain at pelvic floor and leakage symptoms.   ? Time 3   ? Period Months   ? Status New   ? Target Date 08/01/21   ?  ? PT LONG TERM GOAL #4  ? Title pt to report no fecal leakage noted for at least 2 weeks with good recall of voiding mechanics and breathing mechanics to decrease strain and fecal leakage.   ? Time 3   ? Period Months   ? Status New   ? Target Date 08/01/21   ? ?  ?  ? ?  ? ? ? ? ? ? ? ? Plan - 07/22/21 0931   ? ? Clinical Impression Statement Pt presents to clinic reporting she has not been able to make it to previous sessions due to change in insurance and feels like she has regressed. Pt session focused on core and hip strenghtneing with coordination of breathing and pelvic floor mobility. Pt tolerated well, denied additional questions but did need moderate cues for coordination. Pt continues to demonstrate need of PT to further address deficits.   ? Personal Factors and Comorbidities Comorbidity 2;Fitness;Time since onset of injury/illness/exacerbation   ? Comorbidities x2 c-sections with larger babies per pt (10# and 9#)   ? Examination-Activity Limitations Continence;Toileting;Other   running, working out  ? Examination-Participation Restrictions Community Activity   ?  Stability/Clinical Decision Making Stable/Uncomplicated   ? Rehab Potential Good   ? PT Frequency 1x / week   ? PT  Duration Other (comment)   10 visits  ? PT Treatment/Interventions ADLs/Self Care Home Management;Functional mobility training;Therapeutic activities;Therapeutic exercise;Neuromuscular re-education;Manual techniques;Patient/family education;Passive range of motion;Energy conservation   ? PT Next Visit Plan coordination of breathing/core/PF   ? PT Home Exercise Plan [email or web address]   ? Consulted and Agree with Plan of Care Patient   ? ?  ?  ? ?  ? ? ?Patient will benefit from skilled therapeutic intervention in order to improve the following deficits and impairments:  Postural dysfunction, Decreased strength, Decreased mobility, Impaired flexibility, Improper body mechanics, Decreased endurance, Decreased coordination ? ?Visit Diagnosis: ?Muscle weakness (generalized) ? ?Abnormal posture ? ? ? ? ?Problem List ?Patient Active Problem List  ? Diagnosis Date Noted  ? GAD (generalized anxiety disorder) 02/06/2018  ? Lupus anticoagulant positive 09/28/2016  ? S/P cesarean section 10/05/2015  ? Viral meningitis 01/07/2015  ? Psoriatic arthritis (HCC) 01/07/2013  ? Psoriasis 12/26/2012  ? Ulcerative colitis (HCC) 12/26/2012  ? ? ?Otelia Sergeant, PT, DPT ?07/22/2308:42 AM  ? ?[place] ?[facility] ?[address] ?[place], [place], [address] ?Phone: [phone]   Fax:  [phone] ? ?Name: Ariel Pierce ?MRN: [id number] ?Date of Birth: 10/20/1980 ? ? ? ?

## 2021-07-29 ENCOUNTER — Ambulatory Visit: Payer: 59 | Attending: Obstetrics & Gynecology | Admitting: Physical Therapy

## 2021-07-29 DIAGNOSIS — R279 Unspecified lack of coordination: Secondary | ICD-10-CM | POA: Insufficient documentation

## 2021-07-29 DIAGNOSIS — M6281 Muscle weakness (generalized): Secondary | ICD-10-CM | POA: Diagnosis not present

## 2021-07-29 DIAGNOSIS — N393 Stress incontinence (female) (male): Secondary | ICD-10-CM | POA: Diagnosis present

## 2021-07-29 DIAGNOSIS — R293 Abnormal posture: Secondary | ICD-10-CM | POA: Insufficient documentation

## 2021-07-29 NOTE — Therapy (Signed)
[place] ?[facility] ?[address] ?[place], [place], [address] ?Phone: [phone]   Fax:  [phone] ? ?Physical Therapy Treatment ? ?Patient Details  ?Name: MERCEDES FORT ?MRN: [id number] ?Date of Birth: May 18, 1980 ?Referring Provider (PT): Jerene Bears, MD ? ? ?Encounter Date: 07/29/2021 ? ? PT End of Session - 07/29/21 0929   ? ? Visit Number 6   ? Date for PT Re-Evaluation 10/28/21   ? Authorization Type UHC   ? Authorization - Number of Visits 60   ? PT Start Time [phone]   ? PT Stop Time [phone]   pt requested to end session early to return home to pickup children  ? PT Time Calculation (min) 34 min   ? Activity Tolerance Patient tolerated treatment well   ? Behavior During Therapy [facility] for tasks assessed/performed   ? ?  ?  ? ?  ? ? ?Past Medical History:  ?Diagnosis Date  ? Abnormal Pap smear   ? h/o LEEP 2012, neg paps since  ? Allergy   ? Anxiety   ? panic attacks - no meds currently  ? Arthritis   ? hands hips shoulder  ? Headache   ? HSV infection   ? Lupus anticoagulant disorder (HCC)   ? Pregnancy related carpal tunnel syndrome, antepartum 2013  ? Psoriatic arthritis (HCC)   ? Thrombocytopenia complicating pregnancy (HCC)   ? 2017 pregnancy  ? Ulcerative colitis (HCC)   ? dx 2014, Dr. Loreta Ave  ? ? ?Past Surgical History:  ?Procedure Laterality Date  ? CESAREAN SECTION  11/16/2011  ? Procedure: CESAREAN SECTION;  Surgeon: Jeani Hawking, MD;  Location: [facility];  Service: Gynecology;  Laterality: N/A;  ? CESAREAN SECTION N/A 10/05/2015  ? Procedure: CESAREAN SECTION REPEAT;  Surgeon: Zelphia Cairo, MD;  Location: [facility];  Service: Obstetrics;  Laterality: N/A;  [facility] Center T to RNFA - confirmed ?edc 10/12/15  ? LEEP    ? MOUTH SURGERY    ? WISDOM TOOTH EXTRACTION    ? ? ?There were no vitals filed for this visit. ? ? Subjective Assessment - 07/29/21 0851   ? ? Subjective Pt reports she was a lot of improvement but with neding to take time out of therapy  has seen regression, has noticed some stool while wiping after having BM   ? ?  ?  ? ?  ? ? ? ? ? [facility] PT Assessment - 07/29/21 0001   ? ?  ? Assessment  ? Medical Diagnosis N39.3 (ICD-10-CM) - Stress incontinence   ? Referring Provider (PT) Jerene Bears, MD   ? Onset Date/Surgical Date --   at least 5 years  ? Prior Therapy not for PFPT, with have PT for a knee prior   ? ?  ?  ? ?  ? ? ? ? ? ? ? ? ? ? ? ? ? Pelvic Floor Special Questions - 07/29/21 0001   ? ? Pelvic Floor Internal Exam patient identified and patient confirms consent for PT to perform internal soft tissue work and muscle strength and integrity assessmen   ? Exam Type Vaginal   ? Sensation WFL   ? Palpation no TTP   ? Strength strong squeeze, against strong resistance   ? Strength # of reps 4   ? Strength # of seconds 6   ? Tone greatly improved   ? ?  ?  ? ?  ? ? ? ? [facility] Adult PT  Treatment/Exercise - 07/29/21 0001   ? ?  ? Self-Care  ? Self-Care Other Self-Care Comments   ? Other Self-Care Comments  Pt reports she has seen a minimal return since having to take away from PT in fecal streaking in underwear or residual stool after a BM later when wiping after urination. This is only have a BM previously. Pt reports she is straining to have BMs. Pt educated on step stool use for BMs and balloon breathing technique to decrease straining and decrease strain at pelvic floor. Abdominal massage also discussed and handouts given.   ?  ? Neuro Re-ed   ? Neuro Re-ed Details  internal pelvic floor vaginal treatment: 2x10 pelvic floor contractions; 2x10 quick flicks; 10x10s isometric holds. Pt benefited from quick relase techniques for improved facilitation and muscle strengthening for 5 reps initially then demonstrated 5/5 pelvic strength but returned to 4/5 strength with fatigue. Pt's best findings recorded in pelvic floor section. Pt does benefit from additional cues for coordination with core activation, breathing mechanics, to improve pelvic floor  mobility. x5 balloon breathing techniques post internal treatment to address decreasing strain at pelvic floor for BMs, sitting with squatty potty to simulation voiding mechanics at home.   ? ?  ?  ? ?  ? ? ? ? ? ? ? ? ? ? PT Education - 07/29/21 0925   ? ? Education Details coordination of pelvic floor with strength training, breathing mechanics, voiding mechanics, abdominal massage, proper techniques with pelvic floor contract/relax   ? Person(s) Educated Patient   ? Methods Explanation;Demonstration;Tactile cues;Verbal cues;Handout   ? Comprehension Returned demonstration;Verbalized understanding   ? ?  ?  ? ?  ? ? ? PT Short Term Goals - 07/29/21 0928   ? ?  ? PT SHORT TERM GOAL #1  ? Title Pt to be I with HEP   ? Time 5   ? Period Weeks   ? Status Achieved   ? Target Date 06/07/21   ?  ? PT SHORT TERM GOAL #2  ? Title pt to report no more than 1 leak of urine per day for improvement of symptoms and need of one panty liner per day.   ? Time 5   ? Period Weeks   ? Status Achieved   ? Target Date 06/07/21   ?  ? PT SHORT TERM GOAL #3  ? Title pt to demonstrate improvement with coordination for core and pelvic floor with breathing mechanics for all exericses at least 50% of the time to decrease strain at pelvic floor and leakage symptoms.   ? Time 5   ? Period Weeks   ? Status On-going   ? Target Date 06/07/21   ? ?  ?  ? ?  ? ? ? ? PT Long Term Goals - 07/29/21 0928   ? ?  ? PT LONG TERM GOAL #1  ? Title Pt to be I with HEP   ? Status On-going   ?  ? PT LONG TERM GOAL #2  ? Title pt to report no more than 1 leak of urine per week for improvement of symptoms and need of one panty liner per day.   ? Time 3   ? Period Months   ? Status On-going   ? Target Date 08/01/21   ?  ? PT LONG TERM GOAL #3  ? Title pt to demonstrate improvement with coordination for core and pelvic floor with breathing mechanics for all exericses at least 75%  of the time to decrease strain at pelvic floor and leakage symptoms.   ? Time 3   ?  Period Months   ? Status On-going   ? Target Date 08/01/21   ?  ? PT LONG TERM GOAL #4  ? Title pt to report no fecal leakage noted for at least 2 weeks with good recall of voiding mechanics and breathing mechanics to decrease strain and fecal leakage.   ? Time 3   ? Period Months   ? Status On-going   ? Target Date 08/01/21   ? ?  ?  ? ?  ? ? ? ? ? ? ? ? Plan - 07/29/21 0925   ? ? Clinical Impression Statement Pt presents to clinic reporting she seen a return to leakage and stool streak/residual stool post BM while wiping since having to cancel a few session during POC. Pt educated on voiding mechanics, step stool use for BMs, breathing mechanics, coordination of pelvic floor with strengthening and with snezing/laughing/coughing. Pt session focused on this education and internal pelvic floor treatment vaginally with pt's consent. Pt does demonstrate improved strengthening but needs cues to decrease compensatory strategies and improve coordination. Pt continues to progress toward goals with slow progress due to needed to take time off during POC due to insurance and job change. Pt continues to be motivated and would benefit from additional PT.   ? Personal Factors and Comorbidities Comorbidity 2;Fitness;Time since onset of injury/illness/exacerbation   ? Comorbidities x2 c-sections with larger babies per pt (10# and 9#)   ? Examination-Activity Limitations Continence;Toileting;Other   running, working out  ? Examination-Participation Restrictions Community Activity   ? Stability/Clinical Decision Making Stable/Uncomplicated   ? Rehab Potential Good   ? PT Frequency 1x / week   ? PT Duration Other (comment)   10 visits  ? PT Treatment/Interventions ADLs/Self Care Home Management;Functional mobility training;Therapeutic activities;Therapeutic exercise;Neuromuscular re-education;Manual techniques;Patient/family education;Passive range of motion;Energy conservation   ? PT Next Visit Plan coordination of  breathing/core/PF   ? PT Home Exercise Plan [id number]   ? Consulted and Agree with Plan of Care Patient   ? ?  ?  ? ?  ? ? ?Patient will benefit from skilled therapeutic intervention in order to improve the following deficit

## 2021-08-05 ENCOUNTER — Encounter: Payer: 59 | Admitting: Physical Therapy

## 2021-08-11 ENCOUNTER — Encounter: Payer: Self-pay | Admitting: Physical Therapy

## 2021-08-19 ENCOUNTER — Ambulatory Visit: Payer: 59 | Admitting: Physical Therapy

## 2021-08-19 DIAGNOSIS — R279 Unspecified lack of coordination: Secondary | ICD-10-CM

## 2021-08-19 DIAGNOSIS — R293 Abnormal posture: Secondary | ICD-10-CM

## 2021-08-19 DIAGNOSIS — M6281 Muscle weakness (generalized): Secondary | ICD-10-CM

## 2021-08-19 NOTE — Therapy (Signed)
[place] ?[facility] ?[place], [place], [address] ?Phone: [phone]   Fax:  [phone] ? ?Physical Therapy Treatment ? ?Patient Details  ?Name: Ariel Pierce ?MRN: [id number] ?Date of Birth: May 30, 1980 ?Referring Provider (PT): Megan Salon, MD ? ? ?Encounter Date: 08/19/2021 ? ? PT End of Session - 08/19/21 0846   ? ? Visit Number 7   ? Date for PT Re-Evaluation 10/28/21   ? Authorization Type UHC   ? Authorization - Number of Visits 60   ? PT Start Time [phone]   ? PT Stop Time [id number]   ? PT Time Calculation (min) 41 min   ? Activity Tolerance Patient tolerated treatment well   ? Behavior During Therapy [facility] for tasks assessed/performed   ? ?  ?  ? ?  ? ? ?Past Medical History:  ?Diagnosis Date  ? Abnormal Pap smear   ? h/o LEEP 2012, neg paps since  ? Allergy   ? Anxiety   ? panic attacks - no meds currently  ? Arthritis   ? hands hips shoulder  ? Headache   ? HSV infection   ? Lupus anticoagulant disorder ([place])   ? Pregnancy related carpal tunnel syndrome, antepartum 2013  ? Psoriatic arthritis ([place])   ? Thrombocytopenia complicating pregnancy ([place])   ? 2017 pregnancy  ? Ulcerative colitis ([place])   ? dx 2014, Dr. Collene Mares  ? ? ?Past Surgical History:  ?Procedure Laterality Date  ? CESAREAN SECTION  11/16/2011  ? Procedure: CESAREAN SECTION;  Surgeon: Cyril Mourning, MD;  Location: [facility];  Service: Gynecology;  Laterality: N/A;  ? CESAREAN SECTION N/A 10/05/2015  ? Procedure: CESAREAN SECTION REPEAT;  Surgeon: Marylynn Pearson, MD;  Location: [place];  Service: Obstetrics;  Laterality: N/A;  Linus Orn T to RNFA - confirmed ?edc 10/12/15  ? LEEP    ? MOUTH SURGERY    ? WISDOM TOOTH EXTRACTION    ? ? ?There were no vitals filed for this visit. ? ? Subjective Assessment - 08/19/21 0848   ? ? Subjective Pt reports she feels more coordinated and leakage has improved a lot. Pt reports she hasn't at all. Pt also states BMs have improved a lot too with  much few instances of "left over stool".   ? Pertinent History 2 c-sections 41yo, 41yo   ? How long can you sit comfortably? no limits   ? How long can you stand comfortably? no limts   ? How long can you walk comfortably? no limits   ? Patient Stated Goals to have less leakage   ? ?  ?  ? ?  ? ? ? ? ? ? ? ? ? ? ? ? ? ? ? ? ? ? ? ? [place] Adult PT Treatment/Exercise - 08/19/21 0001   ? ?  ? Exercises  ? Exercises Lumbar;Knee/Hip   ?  ? Lumbar Exercises: Standing  ? Functional Squats 20 reps   ? Functional Squats Limitations 20# kettle bell   ? Other Standing Lumbar Exercises deadlifts 20# 2x10; unilateral bosu ball squats 20# x10 each   ? Other Standing Lumbar Exercises bil shoulder abduction with exhale and pelvic floor contraction green band x20; palloffs 10# x20; incline push ups with alt shoulder taps x10   ?  ? Knee/Hip Exercises: Standing  ? Forward Lunges Both;10 reps   ? Forward Lunges Limitations forward leg on bosu   ? Other Standing Knee Exercises squats on bosu  ball x10   ? ?  ?  ? ?  ? ? ? ? ? ? ? ? ? ? ? ? PT Short Term Goals - 07/29/21 0928   ? ?  ? PT SHORT TERM GOAL #1  ? Title Pt to be I with HEP   ? Time 5   ? Period Weeks   ? Status Achieved   ? Target Date 06/07/21   ?  ? PT SHORT TERM GOAL #2  ? Title pt to report no more than 1 leak of urine per day for improvement of symptoms and need of one panty liner per day.   ? Time 5   ? Period Weeks   ? Status Achieved   ? Target Date 06/07/21   ?  ? PT SHORT TERM GOAL #3  ? Title pt to demonstrate improvement with coordination for core and pelvic floor with breathing mechanics for all exericses at least 50% of the time to decrease strain at pelvic floor and leakage symptoms.   ? Time 5   ? Period Weeks   ? Status On-going   ? Target Date 06/07/21   ? ?  ?  ? ?  ? ? ? ? PT Long Term Goals - 07/29/21 0928   ? ?  ? PT LONG TERM GOAL #1  ? Title Pt to be I with HEP   ? Status On-going   ?  ? PT LONG TERM GOAL #2  ? Title pt to report no more than 1 leak of  urine per week for improvement of symptoms and need of one panty liner per day.   ? Time 3   ? Period Months   ? Status On-going   ? Target Date 08/01/21   ?  ? PT LONG TERM GOAL #3  ? Title pt to demonstrate improvement with coordination for core and pelvic floor with breathing mechanics for all exericses at least 75% of the time to decrease strain at pelvic floor and leakage symptoms.   ? Time 3   ? Period Months   ? Status On-going   ? Target Date 08/01/21   ?  ? PT LONG TERM GOAL #4  ? Title pt to report no fecal leakage noted for at least 2 weeks with good recall of voiding mechanics and breathing mechanics to decrease strain and fecal leakage.   ? Time 3   ? Period Months   ? Status On-going   ? Target Date 08/01/21   ? ?  ?  ? ?  ? ? ? ? ? ? ? ? Plan - 08/19/21 0931   ? ? Clinical Impression Statement Pt presents to clinic reporting improvement in symptoms greatly since last visit, improved stool streaking a swell. Pt session focused on hip and core strengthening with coordination of pelvic floor and breathing with all strengthening. Pt tolerated well did continue to need cues for coordination. . Pt continues to progress toward goals with slow progress due to needed to take time off during POC due to insurance and job change. Pt continues to be motivated and would benefit from additional PT.   ? Personal Factors and Comorbidities Comorbidity 2;Fitness;Time since onset of injury/illness/exacerbation   ? Comorbidities x2 c-sections with larger babies per pt (10# and 9#)   ? Examination-Activity Limitations Continence;Toileting;Other   running, working out  ? Examination-Participation Restrictions Community Activity   ? Stability/Clinical Decision Making Stable/Uncomplicated   ? Rehab Potential Good   ? PT Frequency  1x / week   ? PT Duration Other (comment)   10 visits  ? PT Treatment/Interventions ADLs/Self Care Home Management;Functional mobility training;Therapeutic activities;Therapeutic  exercise;Neuromuscular re-education;Manual techniques;Patient/family education;Passive range of motion;Energy conservation   ? PT Next Visit Plan coordination of breathing/core/PF   ? PT Home Exercise Plan [id number]   ? Consulted and Agree with Plan of Care Patient   ? ?  ?  ? ?  ? ? ?Patient will benefit from skilled therapeutic intervention in order to improve the following deficits and impairments:  Postural dysfunction, Decreased strength, Decreased mobility, Impaired flexibility, Improper body mechanics, Decreased endurance, Decreased coordination ? ?Visit Diagnosis: ?Muscle weakness (generalized) ? ?Unspecified lack of coordination ? ?Abnormal posture ? ? ? ? ?Problem List ?Patient Active Problem List  ? Diagnosis Date Noted  ? GAD (generalized anxiety disorder) 02/06/2018  ? Lupus anticoagulant positive 09/28/2016  ? S/P cesarean section 10/05/2015  ? Viral meningitis 01/07/2015  ? Psoriatic arthritis ([place]) 01/07/2013  ? Psoriasis 12/26/2012  ? Ulcerative colitis ([place]) 12/26/2012  ? ? ?Junie Panning, PT ?08/19/2021, 9:32 AM ? ?[place] ?[facility] [employer] ?[place], [place], [address] ?Phone: [phone]   Fax:  [phone] ? ?Name: Ariel Pierce ?MRN: [id number] ?Date of Birth: 06-Feb-1981 ? ? ? ?

## 2021-08-26 ENCOUNTER — Ambulatory Visit: Payer: 59 | Admitting: Physical Therapy

## 2021-09-02 ENCOUNTER — Encounter: Payer: Self-pay | Admitting: Physical Therapy

## 2021-09-09 ENCOUNTER — Ambulatory Visit: Payer: 59 | Admitting: Physical Therapy

## 2021-09-30 ENCOUNTER — Ambulatory Visit: Payer: 59 | Admitting: Physical Therapy

## 2021-10-07 ENCOUNTER — Encounter: Payer: Self-pay | Admitting: Physical Therapy

## 2021-10-13 ENCOUNTER — Ambulatory Visit: Payer: 59 | Attending: Obstetrics & Gynecology | Admitting: Physical Therapy

## 2021-10-13 DIAGNOSIS — N393 Stress incontinence (female) (male): Secondary | ICD-10-CM | POA: Insufficient documentation

## 2021-10-13 DIAGNOSIS — M6281 Muscle weakness (generalized): Secondary | ICD-10-CM | POA: Diagnosis present

## 2021-10-13 NOTE — Therapy (Signed)
[facility] @ [address] [place], [place], [address] Phone: [phone]   Fax:  [phone]  Physical Therapy Treatment  Patient Details  Name: Ariel Pierce MRN: [id number] Date of Birth: 11/06/1980 Referring Provider (PT): Megan Salon, MD   Encounter Date: 10/13/2021   PT End of Session - 10/13/21 0850     Visit Number 8    Date for PT Re-Evaluation 10/28/21    Authorization Type UHC    Authorization - Number of Visits 77    PT Start Time 7628    PT Stop Time 0929    PT Time Calculation (min) 43 min    Activity Tolerance Patient tolerated treatment well    Behavior During Therapy [facility] for tasks assessed/performed             Past Medical History:  Diagnosis Date   Abnormal Pap smear    h/o LEEP 2012, neg paps since   Allergy    Anxiety    panic attacks - no meds currently   Arthritis    hands hips shoulder   Headache    HSV infection    Lupus anticoagulant disorder ([place])    Pregnancy related carpal tunnel syndrome, antepartum 2013   Psoriatic arthritis ([place])    Thrombocytopenia complicating pregnancy ([place])    2017 pregnancy   Ulcerative colitis ([place])    dx 2014, Dr. Collene Mares    Past Surgical History:  Procedure Laterality Date   CESAREAN SECTION  11/16/2011   Procedure: CESAREAN SECTION;  Surgeon: Cyril Mourning, MD;  Location: [facility];  Service: Gynecology;  Laterality: N/A;   CESAREAN SECTION N/A 10/05/2015   Procedure: CESAREAN SECTION REPEAT;  Surgeon: Marylynn Pearson, MD;  Location: [facility];  Service: Obstetrics;  Laterality: N/A;  Linus Orn T to RNFA - confirmed edc 10/12/15   LEEP     MOUTH SURGERY     WISDOM TOOTH EXTRACTION      There were no vitals filed for this visit.   Subjective Assessment - 10/13/21 0859     Subjective Pt reports no leakage since last visit, continues to implement pelvic floor coordination with work outs and doing well with this.    Currently in  Pain? No/denies                [facility] PT Assessment - 10/13/21 0001       Assessment   Medical Diagnosis N39.3 (ICD-10-CM) - Stress incontinence    Referring Provider (PT) Megan Salon, MD    Onset Date/Surgical Date --   at least 5 years   Prior Therapy not for PFPT, with have PT for a knee prior                           [facility] Adult PT Treatment/Exercise - 10/13/21 0001       Exercises   Exercises Lumbar;Knee/Hip      Lumbar Exercises: Standing   Functional Squats Other (comment)    Functional Squats Limitations 20# kettle bell 3x10    Other Standing Lumbar Exercises deadlifts 20# 3x10; unilateral bosu ball squats 20# x10 each; quick paced bosu ball step ups x20;    Other Standing Lumbar Exercises bridges x20 with alt single leg lifts; step up onto mat table x15 each leg; lateral step up x15 each onto mat table.  PT Short Term Goals - 10/13/21 0930       PT SHORT TERM GOAL #1   Title Pt to be I with HEP    Time 5    Period Weeks    Status Achieved    Target Date 06/07/21      PT SHORT TERM GOAL #2   Title pt to report no more than 1 leak of urine per day for improvement of symptoms and need of one panty liner per day.    Time 5    Period Weeks    Status Achieved    Target Date 06/07/21      PT SHORT TERM GOAL #3   Title pt to demonstrate improvement with coordination for core and pelvic floor with breathing mechanics for all exericses at least 50% of the time to decrease strain at pelvic floor and leakage symptoms.    Time 5    Period Weeks    Status Achieved    Target Date 06/07/21               PT Long Term Goals - 10/13/21 0930       PT LONG TERM GOAL #1   Title Pt to be I with HEP    Status Achieved      PT LONG TERM GOAL #2   Title pt to report no more than 1 leak of urine per week for improvement of symptoms and need of one panty liner per day.    Time 3    Period Months    Status  Achieved    Target Date 08/01/21      PT LONG TERM GOAL #3   Title pt to demonstrate improvement with coordination for core and pelvic floor with breathing mechanics for all exericses at least 75% of the time to decrease strain at pelvic floor and leakage symptoms.    Time 3    Period Months    Status Achieved    Target Date 08/01/21      PT LONG TERM GOAL #4   Title pt to report no fecal leakage noted for at least 2 weeks with good recall of voiding mechanics and breathing mechanics to decrease strain and fecal leakage.    Time 3    Period Months    Status Achieved    Target Date 08/01/21                   Plan - 10/13/21 0930     Clinical Impression Statement Pt presents to clinic reporting she has been doing much better, no longer having leakage. Pt reports she is very pleased with progress overall. Pt session focused on coordination of pelvic floor with higher level standing exercsies with increased reps or quicker pace of exercises to challenge stressors, pt still had no leakage reports she has a better understanding of coordination and feels better about carrying this into her work outs and functional tasks at home. Pt has met all goals and agreeable to DC today.    Personal Factors and Comorbidities Comorbidity 2;Fitness;Time since onset of injury/illness/exacerbation    Comorbidities x2 c-sections with larger babies per pt (10# and 9#)    Examination-Activity Limitations Continence;Toileting;Other   running, working out   [employer] Activity    Stability/Clinical Decision Making Stable/Uncomplicated    Rehab Potential Good    PT Frequency 1x / week    PT Duration Other (comment)   10 visits   PT Treatment/Interventions ADLs/Self  Care Home Management;Functional mobility training;Therapeutic activities;Therapeutic exercise;Neuromuscular re-education;Manual techniques;Patient/family education;Passive range of motion;Energy conservation     PT Next Visit Plan --    PT Home Exercise Plan [id number]    Consulted and Agree with Plan of Care Patient             Patient will benefit from skilled therapeutic intervention in order to improve the following deficits and impairments:  Postural dysfunction, Decreased strength, Decreased mobility, Impaired flexibility, Improper body mechanics, Decreased endurance, Decreased coordination  Visit Diagnosis: Muscle weakness (generalized)  Stress incontinence (female) (female)     Problem List Patient Active Problem List   Diagnosis Date Noted   GAD (generalized anxiety disorder) 02/06/2018   Lupus anticoagulant positive 09/28/2016   S/P cesarean section 10/05/2015   Viral meningitis 01/07/2015   Psoriatic arthritis ([place]) 01/07/2013   Psoriasis 12/26/2012   Ulcerative colitis ([place]) 12/26/2012   PHYSICAL THERAPY DISCHARGE SUMMARY  Visits from Start of Care: 8  Current functional level related to goals / functional outcomes: All goals et   Remaining deficits: None per pt   Education / Equipment: HEP   Patient agrees to discharge. Patient goals were met. Patient is being discharged due to meeting the stated rehab goals. Thank you for the referral.    Stacy Gardner, PT, DPT 06/22/239:32 AM   [facility] @ [address] [place] [place], [place], [address] Phone: [phone]   Fax:  [phone]  Name: Ariel Pierce MRN: [id number] Date of Birth: 03-31-1981

## 2021-10-14 ENCOUNTER — Encounter: Payer: Self-pay | Admitting: Physical Therapy

## 2021-10-28 ENCOUNTER — Encounter: Payer: Self-pay | Admitting: Physical Therapy

## 2021-11-04 ENCOUNTER — Encounter: Payer: Self-pay | Admitting: Physical Therapy

## 2021-12-23 ENCOUNTER — Other Ambulatory Visit (HOSPITAL_COMMUNITY)
Admission: RE | Admit: 2021-12-23 | Discharge: 2021-12-23 | Disposition: A | Discharge status: Home / Self Care | Payer: BC Managed Care – PPO | Source: Ambulatory Visit | Attending: Obstetrics & Gynecology | Admitting: Obstetrics & Gynecology

## 2021-12-23 ENCOUNTER — Encounter (HOSPITAL_BASED_OUTPATIENT_CLINIC_OR_DEPARTMENT_OTHER): Payer: Self-pay | Admitting: Obstetrics & Gynecology

## 2021-12-23 ENCOUNTER — Ambulatory Visit (INDEPENDENT_AMBULATORY_CARE_PROVIDER_SITE_OTHER): Payer: 59 | Admitting: Obstetrics & Gynecology

## 2021-12-23 VITALS — BP 124/76 | HR 97 | Ht 65.0 in | Wt 143.8 lb

## 2021-12-23 DIAGNOSIS — K5190 Ulcerative colitis, unspecified, without complications: Secondary | ICD-10-CM

## 2021-12-23 DIAGNOSIS — Z124 Encounter for screening for malignant neoplasm of cervix: Secondary | ICD-10-CM | POA: Diagnosis not present

## 2021-12-23 DIAGNOSIS — L4050 Arthropathic psoriasis, unspecified: Secondary | ICD-10-CM | POA: Diagnosis not present

## 2021-12-23 DIAGNOSIS — B009 Herpesviral infection, unspecified: Secondary | ICD-10-CM | POA: Insufficient documentation

## 2021-12-23 DIAGNOSIS — Z01419 Encounter for gynecological examination (general) (routine) without abnormal findings: Secondary | ICD-10-CM | POA: Diagnosis not present

## 2021-12-23 MED ORDER — VALACYCLOVIR HCL 500 MG PO TABS: 500.0000 mg | ORAL_TABLET | Freq: Every day | ORAL | 12 refills | Status: DC

## 2021-12-23 NOTE — Progress Notes (Signed)
41 y.o. G33P2002 Married White or Caucasian female here for annual exam.  Cycles are regular and about 21 days.  Flow is heavy but lasts for 3 days.  She has had some spotting before her cycle but that has improved.    Started taking b12 and zinc so lips are much better.    Is off immunosuppression.  She lost her job last year due to downsizing.  She does have a new job but that time was very stressful.  Having some GI issues.    Urinary issues are improved.  She did do PT and this helped.    Patient's last menstrual period was 12/09/2021.          Sexually active: Yes.    The current method of family planning is vasectomy.    Exercising: Yes.     Smoker:  no  Health Maintenance: Pap:  07/07/2019 Negative History of abnormal Pap:  h/o LEEP 2012 MMG:  11/17/2020 Negative Colonoscopy:  06/2012.  Follow up 10 years--next year. Screening Labs: 11/2020   reports that she quit smoking about 17 years ago. Her smoking use included cigarettes. She has a 1.00 pack-year smoking history. She has never used smokeless tobacco. She reports current alcohol use of about 1.0 standard drink of alcohol per week. She reports that she does not use drugs.  Past Medical History:  Diagnosis Date   Abnormal Pap smear    h/o LEEP 2012, neg paps since   Allergy    Anxiety    panic attacks - no meds currently   Arthritis    hands hips shoulder   Headache    HSV infection    Lupus anticoagulant disorder ([place])    Pregnancy related carpal tunnel syndrome, antepartum 2013   Psoriatic arthritis ([place])    Thrombocytopenia complicating pregnancy ([place])    2017 pregnancy   Ulcerative colitis ([place])    dx 2014, Dr. Collene Mares    Past Surgical History:  Procedure Laterality Date   CESAREAN SECTION  11/16/2011   Procedure: CESAREAN SECTION;  Surgeon: Cyril Mourning, MD;  Location: [facility];  Service: Gynecology;  Laterality: N/A;   CESAREAN SECTION N/A 10/05/2015   Procedure: CESAREAN SECTION REPEAT;  Surgeon: Marylynn Pearson, MD;  Location: [facility];  Service: Obstetrics;  Laterality: N/A;  Linus Orn T to RNFA - confirmed edc 10/12/15   LEEP     MOUTH SURGERY     WISDOM TOOTH EXTRACTION      Current Outpatient Medications  Medication Sig Dispense Refill   buPROPion (WELLBUTRIN XL) 150 MG 24 hr tablet Take 1 tablet by mouth daily.     Cholecalciferol 25 MCG (1000 UT) tablet Take by mouth.     CLARITIN-D 24 HOUR 10-240 MG 24 hr tablet Take 1 tablet by mouth daily.     FLUoxetine (PROZAC) 10 MG capsule Take 1 capsule by mouth daily.  2   halobetasol (ULTRAVATE) 0.05 % ointment APP AA ON THE SKIN BID PRF FLARES     LORazepam (ATIVAN) 0.5 MG tablet Take 1 tablet by mouth as needed.     mesalamine (CANASA) 1000 MG suppository Place 1,000 mg rectally at bedtime.     nystatin-triamcinolone (MYCOLOG II) cream Apply topically.     triamcinolone (NASACORT) 55 MCG/ACT AERO nasal inhaler Place 2 sprays into the nose daily.     valACYclovir (VALTREX) 500 MG tablet Take 1 tablet (500 mg total) by mouth daily. 90 tablet 4   No current facility-administered medications for  this visit.    Family History  Problem Relation Age of Onset   Hypertension Maternal Grandmother    Thrombophlebitis Maternal Grandmother        has had "blood clots"   Thyroid disease Maternal Grandmother    Kidney disease Maternal Grandmother    Macular degeneration Maternal Grandmother    Heart disease Father    Arthritis Father    Lymphoma Father    Colitis Brother    Diabetes Cousin     ROS: Constitutional: negative Genitourinary:negative  Exam:   BP 124/76 (BP Location: Right Arm, Patient Position: Sitting, Cuff Size: Normal)   Pulse 97   Ht 5' 5"  (1.651 m)   Wt 143 lb 12.8 oz (65.2 kg)   LMP 12/09/2021   BMI 23.93 kg/m   Height: 5' 5"  (165.1 cm)  General appearance: alert, cooperative and appears stated age Head: Normocephalic, without obvious abnormality, atraumatic Neck: no adenopathy, supple, symmetrical,  trachea midline and thyroid normal to inspection and palpation Lungs: clear to auscultation bilaterally Breasts: normal appearance, no masses or tenderness Heart: regular rate and rhythm Abdomen: soft, non-tender; bowel sounds normal; no masses,  no organomegaly Extremities: extremities normal, atraumatic, no cyanosis or edema Skin: Skin color, texture, turgor normal. No rashes or lesions Lymph nodes: Cervical, supraclavicular, and axillary nodes normal. No abnormal inguinal nodes palpated Neurologic: Grossly normal   Pelvic: External genitalia:  no lesions              Urethra:  normal appearing urethra with no masses, tenderness or lesions              Bartholins and Skenes: normal                 Vagina: normal appearing vagina with normal color and no discharge, no lesions              Cervix: no lesions              Pap taken: Yes.   Bimanual Exam:  Uterus:  normal size, contour, position, consistency, mobility, non-tender              Adnexa: normal adnexa and no mass, fullness, tenderness               Rectovaginal: Confirms               Anus:  normal sphincter tone, no lesions  Chaperone, Octaviano Batty, CMA, was present for exam.  Assessment/Plan: 1. Well woman exam with routine gynecological exam - Pap smear will be obtained today - Mammogram guidelines reviewed - Colonoscopy due next year - lab work done done with PCP - vaccines reviewed/updated  2. Cervical cancer screening  3. HSV infection - RF for valtrex to pharmacy sent today  4. Psoriatic arthritis ([place]) - followed with Dr. Lollie Sails  5. Ulcerative colitis without complications, unspecified location [facility]) - followed by Dr. Collene Mares

## 2021-12-26 IMAGING — MG MM DIGITAL SCREENING BILAT W/ TOMO AND CAD
8 series · 9 of 24 positions shown · non-contrast
Comparison: Previous exam(s).

CLINICAL DATA: Screening.

EXAM:
DIGITAL SCREENING BILATERAL MAMMOGRAM WITH TOMOSYNTHESIS AND CAD
TECHNIQUE: Bilateral screening digital craniocaudal and mediolateral oblique
mammograms were obtained. Bilateral screening digital breast
tomosynthesis was performed. The images were evaluated with
computer-aided detection.

[L MLO synth-2D]
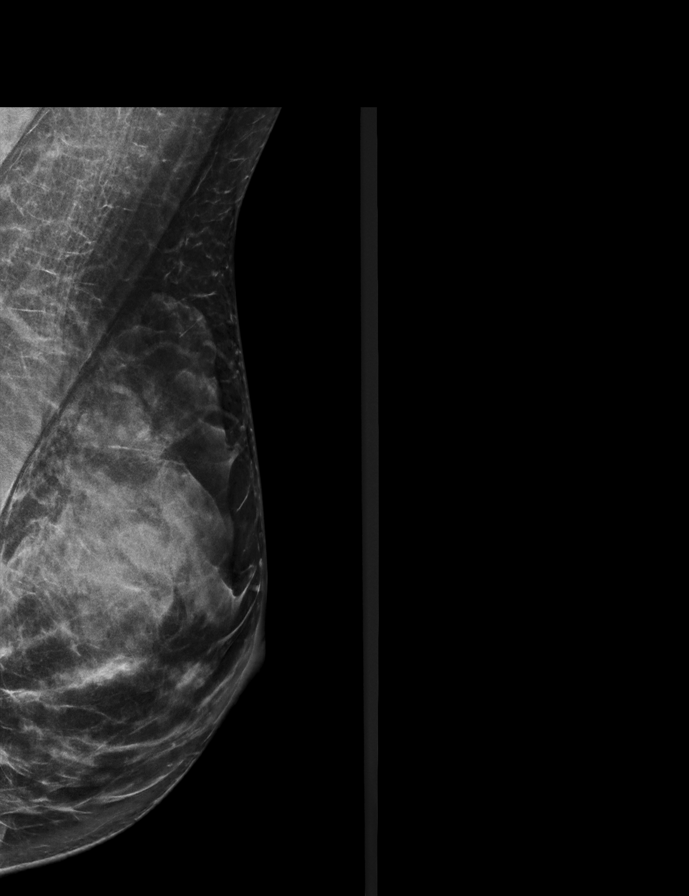

[R CC synth-2D]
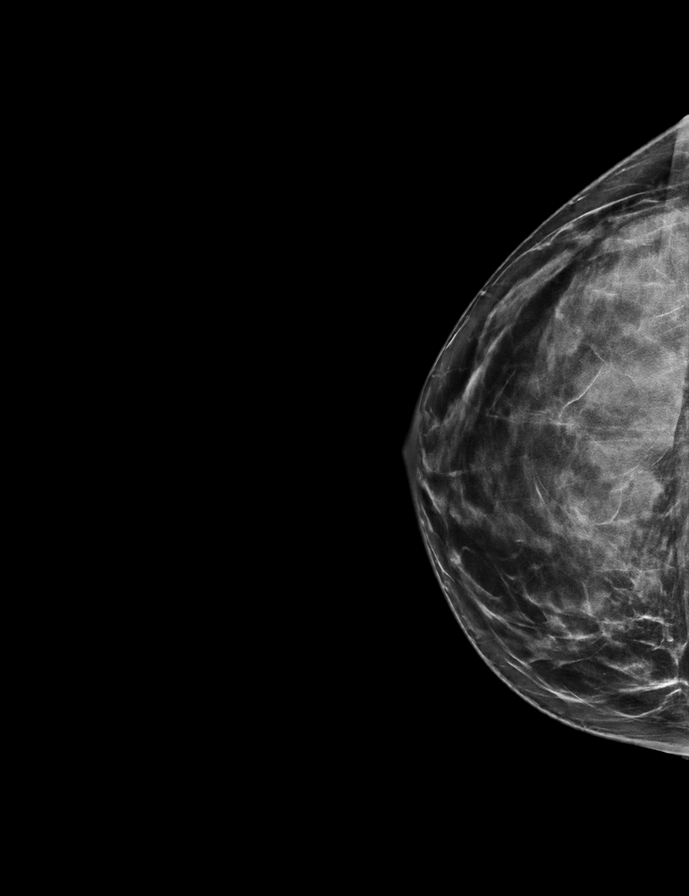

[R MLO synth-2D]
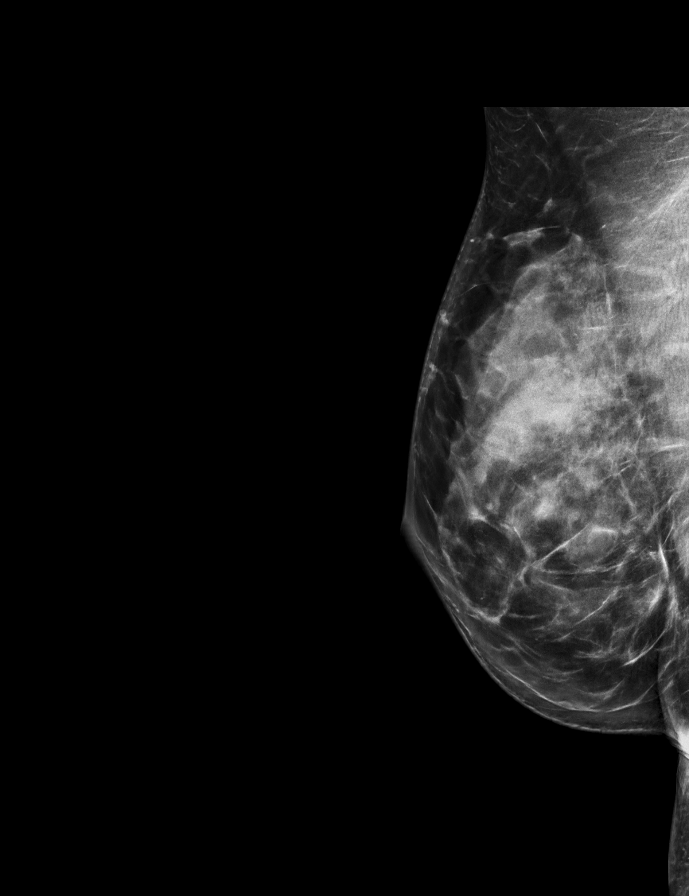

[L CC synth-2D]
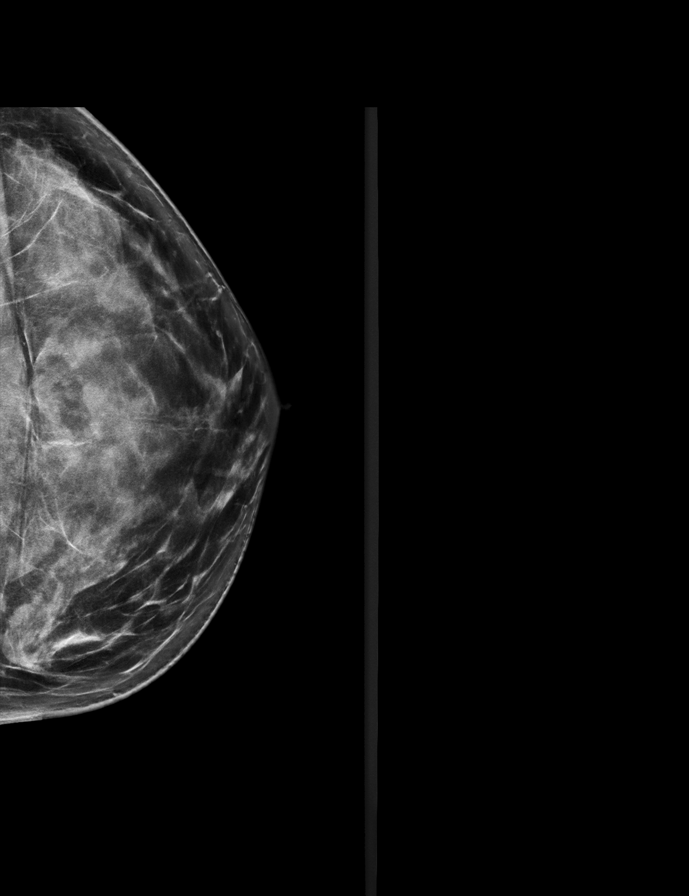

[R MLO tomo · 2 of 75 frames shown]
[frame 25/75]
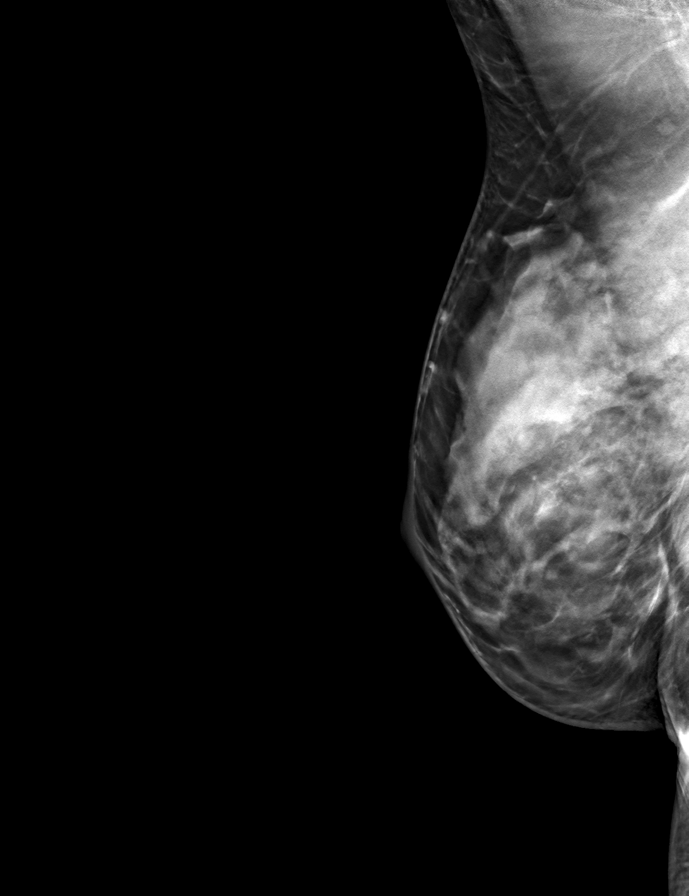
[frame 38/75]
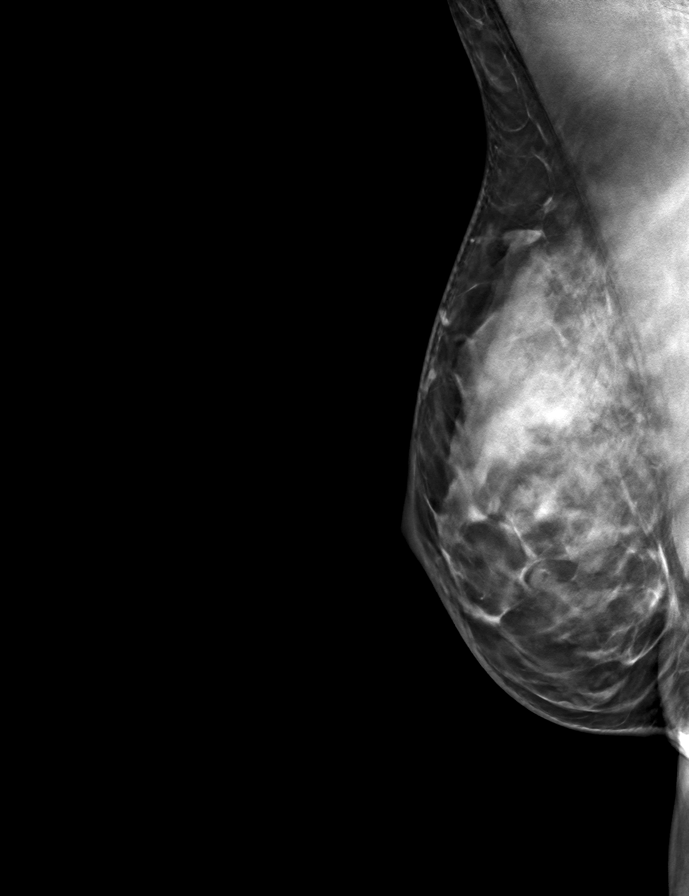

[L CC tomo · tomo slice 35/68.0]
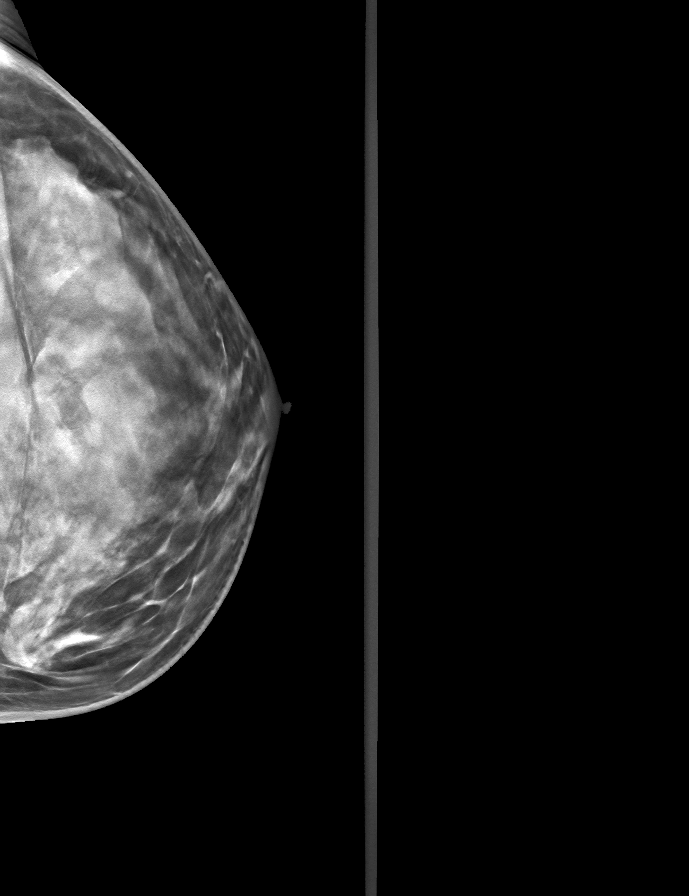

[R CC tomo · tomo slice 33/66.0]
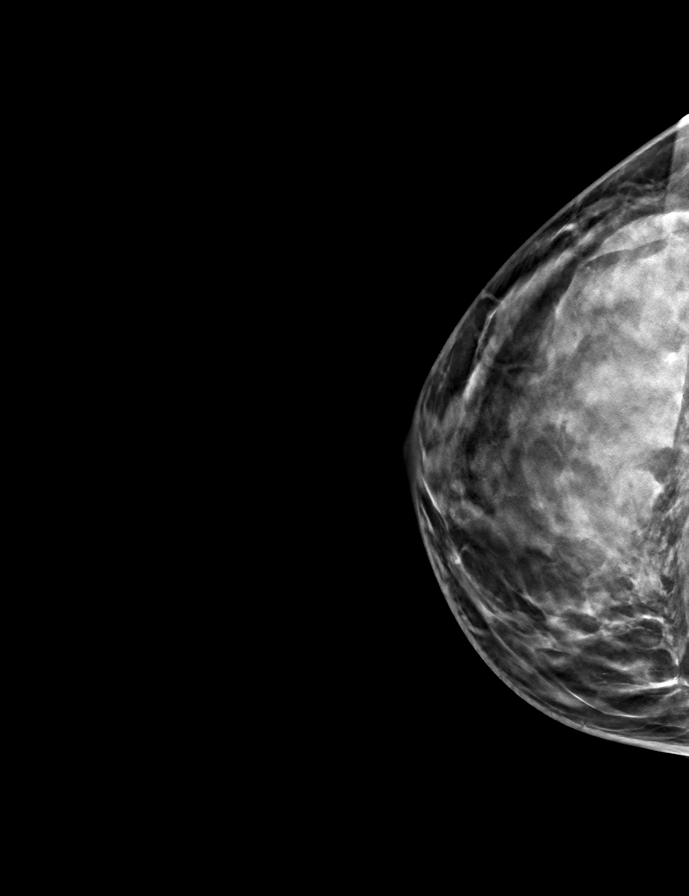

[L MLO tomo · tomo slice 35/69.0]
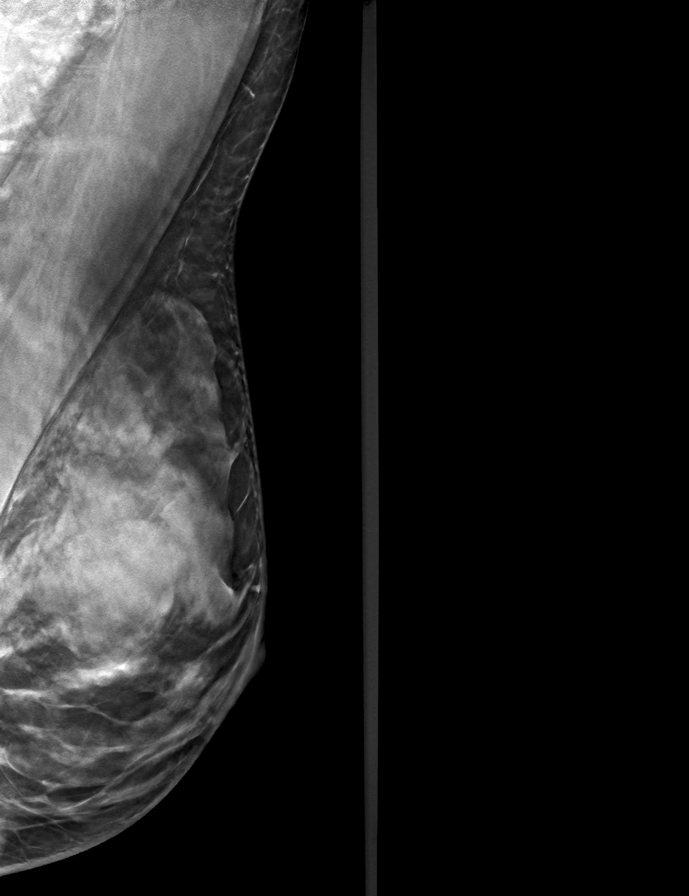

[9 of 24 positions shown; findings below may reference images not displayed]

ACR Breast Density Category c: The breast tissue is heterogeneously
dense, which may obscure small masses.
FINDINGS: There are no findings suspicious for malignancy.
IMPRESSION: No mammographic evidence of malignancy. A result letter of this
screening mammogram will be mailed directly to the patient.

RECOMMENDATION:
Screening mammogram in one year. (Code:Q3-[id number])

BI-RADS CATEGORY  1: Negative.

## 2021-12-28 LAB — CYTOLOGY - PAP
Adequacy: ABSENT
Comment: NEGATIVE
Diagnosis: NEGATIVE
High risk HPV: NEGATIVE

## 2022-08-24 ENCOUNTER — Ambulatory Visit (HOSPITAL_COMMUNITY)
Admission: EM | Admit: 2022-08-24 | Discharge: 2022-08-24 | Disposition: A | Discharge status: Home / Self Care | Payer: BC Managed Care – PPO | Attending: Internal Medicine | Admitting: Internal Medicine

## 2022-08-24 ENCOUNTER — Encounter (HOSPITAL_COMMUNITY): Payer: Self-pay

## 2022-08-24 DIAGNOSIS — J029 Acute pharyngitis, unspecified: Secondary | ICD-10-CM | POA: Diagnosis present

## 2022-08-24 LAB — POCT RAPID STREP A (OFFICE): Rapid Strep A Screen: NEGATIVE

## 2022-08-24 NOTE — ED Triage Notes (Signed)
Pt c/o headache and sore throat x2 days. Taking tylenol and motrin with relief. States hx of strep.

## 2022-08-24 NOTE — ED Provider Notes (Signed)
[facility]    CSN: [id number] Arrival date & time: 08/24/22  1928      History   Chief Complaint Chief Complaint  Patient presents with   Headache   Sore Throat    HPI Ariel Pierce is a 42 y.o. female.   42 year old female presents today due to concerns of possible strep throat.  Both she and her son are prone to strep.  Her 64-year-old son started with symptoms first, patient started with a headache and a sore throat over the past 24 hours.  Patient denies a fever, rash, or nausea.   Headache Sore Throat Associated symptoms include headaches.    Past Medical History:  Diagnosis Date   Abnormal Pap smear    h/o LEEP 2012, neg paps since   Allergy    Anxiety    panic attacks - no meds currently   Arthritis    hands hips shoulder   Headache    HSV infection    Lupus anticoagulant disorder ([facility])    Pregnancy related carpal tunnel syndrome, antepartum 2013   Psoriatic arthritis ([facility])    Thrombocytopenia complicating pregnancy ([facility])    2017 pregnancy   Ulcerative colitis ([facility])    dx 2014, Dr. Loreta Ave    Patient Active Problem List   Diagnosis Date Noted   HSV infection 12/23/2021   GAD (generalized anxiety disorder) 02/06/2018   Lupus anticoagulant positive 09/28/2016   S/P cesarean section 10/05/2015   Viral meningitis 01/07/2015   Psoriatic arthritis ([facility]) 01/07/2013   Psoriasis 12/26/2012   Ulcerative colitis ([facility]) 12/26/2012    Past Surgical History:  Procedure Laterality Date   CESAREAN SECTION  11/16/2011   Procedure: CESAREAN SECTION;  Surgeon: Jeani Hawking, MD;  Location: [facility];  Service: Gynecology;  Laterality: N/A;   CESAREAN SECTION N/A 10/05/2015   Procedure: CESAREAN SECTION REPEAT;  Surgeon: Zelphia Cairo, MD;  Location: [facility];  Service: Obstetrics;  Laterality: N/A;  [facility] T to RNFA - confirmed edc 10/12/15   LEEP     MOUTH SURGERY     WISDOM TOOTH EXTRACTION      OB History     Gravida  2   Para   2   Term  2   Preterm  0   AB  0   Living  2      SAB  0   IAB  0   Ectopic  0   Multiple  0   Live Births  2            Home Medications    Prior to Admission medications   Medication Sig Start Date End Date Taking? Authorizing Provider  buPROPion (WELLBUTRIN XL) 150 MG 24 hr tablet Take 1 tablet by mouth daily. 11/24/16   [provider]  Cholecalciferol 25 MCG (1000 UT) tablet Take by mouth.    [provider]  CLARITIN-D 24 HOUR 10-240 MG 24 hr tablet Take 1 tablet by mouth daily. 03/26/19   [provider]  FLUoxetine (PROZAC) 10 MG capsule Take 1 capsule by mouth daily. 08/27/17   [provider]  halobetasol (ULTRAVATE) 0.05 % ointment APP AA ON THE SKIN BID PRF FLARES 08/09/18   [provider]  LORazepam (ATIVAN) 0.5 MG tablet Take 1 tablet by mouth as needed. 10/16/16   [provider]  mesalamine (CANASA) 1000 MG suppository Place 1,000 mg rectally at bedtime.    [provider]  nystatin-triamcinolone (MYCOLOG II) cream Apply  topically.    [provider]  triamcinolone (NASACORT) 55 MCG/ACT AERO nasal inhaler Place 2 sprays into the nose daily.    [provider]  valACYclovir (VALTREX) 500 MG tablet Take 1 tablet (500 mg total) by mouth daily. 12/23/21   Jerene Bears, MD  norethindrone (MICRONOR,CAMILA,ERRIN) 0.35 MG tablet Take 1 tablet (0.35 mg total) by mouth daily. Patient not taking: Reported on 02/12/2018 09/18/17 03/30/19  Jerene Bears, MD    Family History Family History  Problem Relation Age of Onset   Hypertension Maternal Grandmother    Thrombophlebitis Maternal Grandmother        has had "blood clots"   Thyroid disease Maternal Grandmother    Kidney disease Maternal Grandmother    Macular degeneration Maternal Grandmother    Heart disease Father    Arthritis Father    Lymphoma Father    Colitis Brother    Diabetes Cousin     Social History Social History    Tobacco Use   Smoking status: Former    Packs/day: 0.25    Years: 4.00    Additional pack years: 0.00    Total pack years: 1.00    Types: Cigarettes    Quit date: 04/24/2004    Years since quitting: 18.3   Smokeless tobacco: Never  Vaping Use   Vaping Use: Never used  Substance Use Topics   Alcohol use: Yes    Alcohol/week: 1.0 standard drink of alcohol    Types: 1 Glasses of wine per week    Comment: socially   Drug use: No     Allergies   Latex, Mesalamine, and Zyrtec [cetirizine hcl]   Review of Systems Review of Systems  Neurological:  Positive for headaches.  As per HPI   Physical Exam Triage Vital Signs ED Triage Vitals  Enc Vitals Group     BP 08/24/22 1952 125/76     Pulse Rate 08/24/22 1952 97     Resp 08/24/22 1952 18     Temp 08/24/22 1952 98.2 F (36.8 C)     Temp Source 08/24/22 1952 Oral     SpO2 08/24/22 1952 99 %     Weight --      Height --      Head Circumference --      Peak Flow --      Pain Score 08/24/22 1953 2     Pain Loc --      Pain Edu? --      Excl. in GC? --    No data found.  Updated Vital Signs BP 125/76 (BP Location: Right Arm)   Pulse 97   Temp 98.2 F (36.8 C) (Oral)   Resp 18   LMP 07/22/2022 (Approximate)   SpO2 99%   Visual Acuity Right Eye Distance:   Left Eye Distance:   Bilateral Distance:    Right Eye Near:   Left Eye Near:    Bilateral Near:     Physical Exam Vitals and nursing note reviewed.  Constitutional:      General: She is not in acute distress.    Appearance: She is well-developed and normal weight. She is not ill-appearing, toxic-appearing or diaphoretic.  HENT:     Head: Normocephalic and atraumatic.     Right Ear: Tympanic membrane and ear canal normal. No drainage, swelling or tenderness. No middle ear effusion. Tympanic membrane is not erythematous.     Left Ear: Tympanic membrane and ear canal normal. No drainage, swelling or  tenderness.  No middle ear effusion. Tympanic membrane  is not erythematous.     Nose: No congestion or rhinorrhea.     Mouth/Throat:     Mouth: Mucous membranes are moist. No oral lesions.     Pharynx: Oropharynx is clear. Uvula midline. No pharyngeal swelling, oropharyngeal exudate, posterior oropharyngeal erythema or uvula swelling.     Tonsils: No tonsillar exudate or tonsillar abscesses.  Eyes:     General: No scleral icterus.    Extraocular Movements: Extraocular movements intact.     Right eye: Normal extraocular motion.     Left eye: Normal extraocular motion.     Conjunctiva/sclera: Conjunctivae normal.     Pupils: Pupils are equal, round, and reactive to light.  Neck:     Thyroid: No thyromegaly.  Cardiovascular:     Rate and Rhythm: Normal rate.  Pulmonary:     Effort: Pulmonary effort is normal. No respiratory distress.  Abdominal:     General: Bowel sounds are normal.     Palpations: Abdomen is soft. There is no mass.     Tenderness: There is no rebound.     Hernia: No hernia is present.  Musculoskeletal:     Cervical back: Normal range of motion and neck supple. No rigidity.  Lymphadenopathy:     Cervical: No cervical adenopathy.  Skin:    General: Skin is warm and dry.     Coloration: Skin is not pale.     Findings: No erythema or rash.  Neurological:     General: No focal deficit present.     Mental Status: She is alert and oriented to person, place, and time.  Psychiatric:        Mood and Affect: Mood normal.        Behavior: Behavior normal.      UC Treatments / Results  Labs (all labs ordered are listed, but only abnormal results are displayed) Labs Reviewed  CULTURE, GROUP A STREP [facility])  POCT RAPID STREP A (OFFICE)    EKG   Radiology No results found.  Procedures Procedures (including critical care time)  Medications Ordered in UC Medications - No data to display  Initial Impression / Assessment and Plan / UC Course  I have reviewed the triage vital signs and the nursing  notes.  Pertinent labs & imaging results that were available during my care of the patient were reviewed by me and considered in my medical decision making (see chart for details).     Viral pharyngitis -over-the-counter antipyretics as needed for headache or sore throat.  Salt water gargles, Cepacol lozenges.  Rapid strep is negative, will send out culture for confirmation.  Supportive measures appropriate at this time.  Vital signs stable.   Final Clinical Impressions(s) / UC Diagnoses   Final diagnoses:  Viral pharyngitis     Discharge Instructions      Your strep test is negative, we will call if the throat culture is positive. Please alternate tylenol and ibuprofen as needed.       ED Prescriptions   None    PDMP not reviewed this encounter.   Maretta Bees, [place] 08/24/22 2053

## 2022-08-24 NOTE — Discharge Instructions (Signed)
Your strep test is negative, we will call if the throat culture is positive. Please alternate tylenol and ibuprofen as needed.

## 2022-08-27 LAB — CULTURE, GROUP A STREP (THRC)

## 2023-01-12 ENCOUNTER — Ambulatory Visit (HOSPITAL_BASED_OUTPATIENT_CLINIC_OR_DEPARTMENT_OTHER): Payer: BC Managed Care – PPO | Admitting: Obstetrics & Gynecology

## 2023-01-24 ENCOUNTER — Encounter (HOSPITAL_BASED_OUTPATIENT_CLINIC_OR_DEPARTMENT_OTHER): Payer: Self-pay | Admitting: Emergency Medicine

## 2023-01-24 ENCOUNTER — Emergency Department (HOSPITAL_BASED_OUTPATIENT_CLINIC_OR_DEPARTMENT_OTHER): Payer: BC Managed Care – PPO

## 2023-01-24 ENCOUNTER — Emergency Department (HOSPITAL_BASED_OUTPATIENT_CLINIC_OR_DEPARTMENT_OTHER)
Admission: EM | Admit: 2023-01-24 | Discharge: 2023-01-25 | Disposition: A | Discharge status: Home / Self Care | Payer: BC Managed Care – PPO | Attending: Emergency Medicine | Admitting: Emergency Medicine

## 2023-01-24 ENCOUNTER — Other Ambulatory Visit: Payer: Self-pay

## 2023-01-24 DIAGNOSIS — Z91040 Latex allergy status: Secondary | ICD-10-CM | POA: Insufficient documentation

## 2023-01-24 DIAGNOSIS — R0789 Other chest pain: Secondary | ICD-10-CM | POA: Insufficient documentation

## 2023-01-24 DIAGNOSIS — R791 Abnormal coagulation profile: Secondary | ICD-10-CM | POA: Insufficient documentation

## 2023-01-24 DIAGNOSIS — R000 Tachycardia, unspecified: Secondary | ICD-10-CM | POA: Insufficient documentation

## 2023-01-24 NOTE — ED Triage Notes (Addendum)
Pt presents with mid sternal chest pain that radiates to her throat and to between her shoulder blades.  Began this morning. Worse with movement.  Feels like a "stabbing/cramping"  No n/v/d or shob.  No fever chills or HA.  Pt does feel her heart beating faster than usual.  Pt has a hx of lupus

## 2023-01-25 ENCOUNTER — Emergency Department (HOSPITAL_BASED_OUTPATIENT_CLINIC_OR_DEPARTMENT_OTHER): Payer: BC Managed Care – PPO

## 2023-01-25 LAB — TROPONIN I (HIGH SENSITIVITY)
Troponin I (High Sensitivity): 2 ng/L (ref <18)
Troponin I (High Sensitivity): 3 ng/L (ref <18)

## 2023-01-25 LAB — BASIC METABOLIC PANEL
Anion gap: 9 (ref 5–15)
BUN: 20 mg/dL (ref 6–20)
CO2: 26 mmol/L (ref 22–32)
Calcium: 10.3 mg/dL (ref 8.9–10.3)
Chloride: 100 mmol/L (ref 98–111)
Creatinine, Ser: 0.82 mg/dL (ref 0.44–1.00)
GFR, Estimated: >60 mL/min (ref >60)
Glucose, Bld: 106 mg/dL — ABNORMAL HIGH (ref 70–99)
Potassium: 3.7 mmol/L (ref 3.5–5.1)
Sodium: 135 mmol/L (ref 135–145)

## 2023-01-25 LAB — D-DIMER, QUANTITATIVE: D-Dimer, Quant: 0.78 ug{FEU}/mL — ABNORMAL HIGH (ref 0.00–0.50)

## 2023-01-25 LAB — CBC
HCT: 41.4 % (ref 36.0–46.0)
Hemoglobin: 14.3 g/dL (ref 12.0–15.0)
MCH: 31 pg (ref 26.0–34.0)
MCHC: 34.5 g/dL (ref 30.0–36.0)
MCV: 89.8 fL (ref 80.0–100.0)
Platelets: 192 10*3/uL (ref 150–400)
RBC: 4.61 MIL/uL (ref 3.87–5.11)
RDW: 12.3 % (ref 11.5–15.5)
WBC: 7.6 10*3/uL (ref 4.0–10.5)
nRBC: 0 % (ref 0.0–0.2)

## 2023-01-25 MED ORDER — IOHEXOL 350 MG/ML SOLN
100.0000 mL | Freq: Once | INTRAVENOUS | Status: AC | PRN
  Administered 2023-01-25: 75 mL via INTRAVENOUS

## 2023-01-25 NOTE — ED Notes (Signed)
Patient transported to CT 

## 2023-01-25 NOTE — Discharge Instructions (Signed)
You were seen today for chest pain.  Your workup today is reassuring including testing for blood clots and heart testing.  Follow-up closely with cardiology.  You may end up needing a Holter monitor to monitor for any arrhythmia.  If you have any new or worsening symptoms you should be reevaluated.

## 2023-01-25 NOTE — ED Provider Notes (Signed)
[place] EMERGENCY DEPARTMENT AT [facility] Provider Note   CSN: [id number] Arrival date & time: 01/24/23  2339     History  Chief Complaint  Patient presents with   Chest Pain    Ariel Pierce is a 42 y.o. female.  HPI     This is a 42 year old female who presents with chest pain.  Patient reports onset of symptoms yesterday morning.  She states she has had it throughout the day.  She reports that it is sternal and sometimes radiates into the shoulder blades.  It is worse with certain movements.  It can be sharp and stabbing.  Denies any association with food.  No nausea, vomiting, diarrhea, shortness of breath, fevers or upper respiratory symptoms.  No history of blood clots but does report a history of lupus.  Patient at times states that she feels like her heart is beating faster than usual.  Sometimes she will wake up in the middle of the night with palpitations.  At that time she will take half of the lorazepam and often this will help her symptoms but today feels different.  Home Medications Prior to Admission medications   Medication Sig Start Date End Date Taking? Authorizing Provider  buPROPion (WELLBUTRIN XL) 150 MG 24 hr tablet Take 1 tablet by mouth daily. 11/24/16   [provider]  Cholecalciferol 25 MCG (1000 UT) tablet Take by mouth.    [provider]  CLARITIN-D 24 HOUR 10-240 MG 24 hr tablet Take 1 tablet by mouth daily. 03/26/19   [provider]  FLUoxetine (PROZAC) 10 MG capsule Take 1 capsule by mouth daily. 08/27/17   [provider]  halobetasol (ULTRAVATE) 0.05 % ointment APP AA ON THE SKIN BID PRF FLARES 08/09/18   [provider]  LORazepam (ATIVAN) 0.5 MG tablet Take 1 tablet by mouth as needed. 10/16/16   [provider]  mesalamine (CANASA) 1000 MG suppository Place 1,000 mg rectally at bedtime.    [provider]  nystatin-triamcinolone (MYCOLOG II) cream Apply topically.     [provider]  triamcinolone (NASACORT) 55 MCG/ACT AERO nasal inhaler Place 2 sprays into the nose daily.    [provider]  valACYclovir (VALTREX) 500 MG tablet Take 1 tablet (500 mg total) by mouth daily. 12/23/21   Jerene Bears, MD  norethindrone (MICRONOR,CAMILA,ERRIN) 0.35 MG tablet Take 1 tablet (0.35 mg total) by mouth daily. Patient not taking: Reported on 02/12/2018 09/18/17 03/30/19  Jerene Bears, MD      Allergies    Latex, Mesalamine, and Zyrtec [cetirizine hcl]    Review of Systems   Review of Systems  Constitutional:  Negative for fever.  Respiratory:  Negative for shortness of breath.   Cardiovascular:  Positive for chest pain and palpitations.  Gastrointestinal:  Negative for abdominal pain, nausea and vomiting.  All other systems reviewed and are negative.   Physical Exam Updated Vital Signs BP 117/74   Pulse 93   Temp 98.2 F (36.8 C)   Resp 17   SpO2 99%  Physical Exam Vitals and nursing note reviewed.  Constitutional:      Appearance: She is well-developed. She is not ill-appearing.  HENT:     Head: Normocephalic and atraumatic.  Eyes:     Pupils: Pupils are equal, round, and reactive to light.  Cardiovascular:     Rate and Rhythm: Regular rhythm. Tachycardia present.     Heart sounds: Normal heart sounds.  Pulmonary:  Effort: Pulmonary effort is normal. No respiratory distress.     Breath sounds: No wheezing.  Abdominal:     General: Bowel sounds are normal.     Palpations: Abdomen is soft.  Musculoskeletal:     Cervical back: Neck supple.  Skin:    General: Skin is warm and dry.  Neurological:     General: No focal deficit present.     Mental Status: She is alert and oriented to person, place, and time.     ED Results / Procedures / Treatments   Labs (all labs ordered are listed, but only abnormal results are displayed) Labs Reviewed  BASIC METABOLIC PANEL - Abnormal; Notable for the following components:       Result Value   Glucose, Bld 106 (*)    All other components within normal limits  D-DIMER, QUANTITATIVE - Abnormal; Notable for the following components:   D-Dimer, Quant 0.78 (*)    All other components within normal limits  CBC  PREGNANCY, URINE  TROPONIN I (HIGH SENSITIVITY)  TROPONIN I (HIGH SENSITIVITY)    EKG EKG Interpretation Date/Time:  Wednesday January 24 2023 23:49:32 EDT Ventricular Rate:  110 PR Interval:  118 QRS Duration:  98 QT Interval:  309 QTC Calculation: 418 R Axis:   91  Text Interpretation: Sinus tachycardia Borderline right axis deviation Minimal ST depression, inferior leads Confirmed by Ross Marcus ([id number]) on 01/25/2023 1:53:57 AM  Radiology CT Angio Chest PE W and/or Wo Contrast  Result Date: 01/25/2023 CLINICAL DATA:  Mid sternal chest pain.  Positive D-dimer. EXAM: CT ANGIOGRAPHY CHEST WITH CONTRAST TECHNIQUE: Multidetector CT imaging of the chest was performed using the standard protocol during bolus administration of intravenous contrast. Multiplanar CT image reconstructions and MIPs were obtained to evaluate the vascular anatomy. RADIATION DOSE REDUCTION: This exam was performed according to the departmental dose-optimization program which includes automated exposure control, adjustment of the mA and/or kV according to patient size and/or use of iterative reconstruction technique. CONTRAST:  75mL OMNIPAQUE IOHEXOL 350 MG/ML SOLN COMPARISON:  Chest x-ray today FINDINGS: Cardiovascular: No filling defects in the pulmonary arteries to suggest pulmonary emboli. Heart is normal size. Aorta is normal caliber. Mediastinum/Nodes: No mediastinal, hilar, or axillary adenopathy. Trachea and esophagus are unremarkable. Thyroid unremarkable. Lungs/Pleura: Lungs are clear. No focal airspace opacities or suspicious nodules. No effusions. Upper Abdomen: No acute findings Musculoskeletal: Chest wall soft tissues are unremarkable. No acute bony abnormality. Review of the  MIP images confirms the above findings. IMPRESSION: No evidence of pulmonary embolus. No acute cardiopulmonary disease. Electronically Signed   By: Charlett Nose M.D.   On: 01/25/2023 01:28   DG Chest Port 1 View  Result Date: 01/25/2023 CLINICAL DATA:  Sore throat and chest pain, initial encounter EXAM: PORTABLE CHEST 1 VIEW COMPARISON:  09/13/2012 FINDINGS: The heart size and mediastinal contours are within normal limits. Both lungs are clear. The visualized skeletal structures are unremarkable. IMPRESSION: No active disease. Electronically Signed   By: Alcide Clever M.D.   On: 01/25/2023 00:29    Procedures Procedures    Medications Ordered in ED Medications  iohexol (OMNIPAQUE) 350 MG/ML injection 100 mL (75 mLs Intravenous Contrast Given 01/25/23 0117)    ED Course/ Medical Decision Making/ A&P                                 Medical Decision Making Amount and/or Complexity of Data Reviewed Labs: ordered. Radiology:  ordered.  Risk Prescription drug management.   This patient presents to the ED for concern of chest pain, palpitations, this involves an extensive number of treatment options, and is a complaint that carries with it a high risk of complications and morbidity.  I considered the following differential and admission for this acute, potentially life threatening condition.  The differential diagnosis includes ACS, PE, pneumothorax, pneumonia, arrhythmia, chest wall pain, GI etiology  MDM:    This is a 42 year old female who presents with chest discomfort and palpitations.  She is overall nontoxic.  Vital signs notable for heart rate of 125.  EKG shows sinus tachycardia.  There are no ischemic changes or arrhythmia that is obvious on EKG.  Basic labs obtained.  Initial troponin negative.  D-dimer is elevated.  For this reason CT imaging was obtained and this shows no evidence of obvious pulmonary embolism.  Repeat troponin negative.  Overall ischemic workup is reassuring.   Unclear etiology of symptoms although some features to suggest musculoskeletal etiology.  Will have her follow-up with cardiology as an outpatient.  Patient is agreeable and understanding of plan.  (Labs, imaging, consults)  Labs: I Ordered, and personally interpreted labs.  The pertinent results include: CBC, BMP, troponin x 2, D-dimer  Imaging Studies ordered: I ordered imaging studies including chest x-ray, CT scan I independently visualized and interpreted imaging. I agree with the radiologist interpretation  Additional history obtained from chart review.  External records from outside source obtained and reviewed including prior evaluations  Cardiac Monitoring: The patient was maintained on a cardiac monitor.  If on the cardiac monitor, I personally viewed and interpreted the cardiac monitored which showed an underlying rhythm of: Sinus rhythm  Reevaluation: After the interventions noted above, I reevaluated the patient and found that they have :stayed the same  Social Determinants of Health:  lives independently  Disposition: Discharge  Co morbidities that complicate the patient evaluation  Past Medical History:  Diagnosis Date   Abnormal Pap smear    h/o LEEP 2012, neg paps since   Allergy    Anxiety    panic attacks - no meds currently   Arthritis    hands hips shoulder   Headache    HSV infection    Lupus anticoagulant disorder (HCC)    Pregnancy related carpal tunnel syndrome, antepartum 2013   Psoriatic arthritis (HCC)    Thrombocytopenia complicating pregnancy (HCC)    2017 pregnancy   Ulcerative colitis (HCC)    dx 2014, Dr. Loreta Ave     Medicines Meds ordered this encounter  Medications   iohexol (OMNIPAQUE) 350 MG/ML injection 100 mL    I have reviewed the patients home medicines and have made adjustments as needed  Problem List / ED Course: Problem List Items Addressed This Visit   None Visit Diagnoses     Atypical chest pain    -  Primary    Relevant Orders   Ambulatory referral to Cardiology                   Final Clinical Impression(s) / ED Diagnoses Final diagnoses:  Atypical chest pain    Rx / DC Orders ED Discharge Orders          Ordered    Ambulatory referral to Cardiology        01/25/23 0258              Shon Baton, MD 01/25/23 0302

## 2023-02-13 ENCOUNTER — Encounter (HOSPITAL_BASED_OUTPATIENT_CLINIC_OR_DEPARTMENT_OTHER): Payer: Self-pay | Admitting: Obstetrics & Gynecology

## 2023-02-13 ENCOUNTER — Ambulatory Visit (HOSPITAL_BASED_OUTPATIENT_CLINIC_OR_DEPARTMENT_OTHER): Payer: BC Managed Care – PPO | Admitting: Obstetrics & Gynecology

## 2023-02-13 VITALS — BP 118/72 | HR 77 | Ht 65.0 in | Wt 145.0 lb

## 2023-02-13 DIAGNOSIS — K5190 Ulcerative colitis, unspecified, without complications: Secondary | ICD-10-CM

## 2023-02-13 DIAGNOSIS — B009 Herpesviral infection, unspecified: Secondary | ICD-10-CM

## 2023-02-13 DIAGNOSIS — Z01419 Encounter for gynecological examination (general) (routine) without abnormal findings: Secondary | ICD-10-CM | POA: Diagnosis not present

## 2023-02-13 DIAGNOSIS — Z1231 Encounter for screening mammogram for malignant neoplasm of breast: Secondary | ICD-10-CM

## 2023-02-13 DIAGNOSIS — Z87898 Personal history of other specified conditions: Secondary | ICD-10-CM | POA: Diagnosis not present

## 2023-02-13 DIAGNOSIS — L4050 Arthropathic psoriasis, unspecified: Secondary | ICD-10-CM

## 2023-02-13 MED ORDER — VALACYCLOVIR HCL 500 MG PO TABS
500.0000 mg | ORAL_TABLET | Freq: Every day | ORAL | 12 refills | Status: DC
Start: 2023-02-13 — End: 2024-02-15

## 2023-02-13 NOTE — Progress Notes (Signed)
42 y.o. G57P2002 Married White or Caucasian female here for annual exam.  Was seen in the ER on 10/2 due to worsening tachycardia.  Evaluation included EKG, blood work and CT scan to r/o PE.  She was in sinus tachycardia and d dimer was mildly elevated.  Cardiology f/u was recommended.  She reports receiving a text message for scheduling which seemed odd to the patient.  Reports she was Claritin D and she stopped this but is feeling she needs to restart due to sinus symptoms.  Had another episode over the weekend (and was off the Claritin D).  Pulse got up to 120's.  Has done coronary calcium score on 7/9 and this was 0.  Referral placed today.  Cycles are regular and about every 21 - 27 days.  Flow lasts 3 -5 days.  Heaviness lasts 2-3 days.    Patient's last menstrual period was 02/05/2023 (approximate).          Sexually active: Yes.    The current method of family planning is vasectomy.    Smoker:  no  Health Maintenance: Pap:  12/23/2021 Negative History of abnormal Pap:  LEEP 2012 MMG:  11/17/2020 Negative.  She is aware this is due.   Colonoscopy:  2024, Dr. Loreta Ave. Screening Labs: [facility]   reports that she quit smoking about 18 years ago. Her smoking use included cigarettes. She started smoking about 22 years ago. She has a 1 pack-year smoking history. She has never used smokeless tobacco. She reports current alcohol use of about 1.0 standard drink of alcohol per week. She reports that she does not use drugs.  Past Medical History:  Diagnosis Date   Abnormal Pap smear    h/o LEEP 2012, neg paps since   Allergy    Anxiety    panic attacks - no meds currently   Arthritis    hands hips shoulder   Headache    HSV infection    Lupus anticoagulant disorder (HCC)    Pregnancy related carpal tunnel syndrome, antepartum 2013   Psoriatic arthritis (HCC)    Thrombocytopenia complicating pregnancy (HCC)    2017 pregnancy   Ulcerative colitis (HCC)    dx 2014, Dr. Loreta Ave     Past Surgical History:  Procedure Laterality Date   CESAREAN SECTION  11/16/2011   Procedure: CESAREAN SECTION;  Surgeon: Jeani Hawking, MD;  Location: [facility];  Service: Gynecology;  Laterality: N/A;   CESAREAN SECTION N/A 10/05/2015   Procedure: CESAREAN SECTION REPEAT;  Surgeon: Zelphia Cairo, MD;  Location: [facility] BIRTHING SUITES;  Service: Obstetrics;  Laterality: N/A;  [facility] T to RNFA - confirmed edc 10/12/15   LEEP     MOUTH SURGERY     WISDOM TOOTH EXTRACTION      Current Outpatient Medications  Medication Sig Dispense Refill   buPROPion (WELLBUTRIN XL) 150 MG 24 hr tablet Take 1 tablet by mouth daily.     Cholecalciferol 25 MCG (1000 UT) tablet Take by mouth.     CLARITIN-D 24 HOUR 10-240 MG 24 hr tablet Take 1 tablet by mouth daily.     FLUoxetine (PROZAC) 10 MG capsule Take 1 capsule by mouth daily.  2   halobetasol (ULTRAVATE) 0.05 % ointment APP AA ON THE SKIN BID PRF FLARES     LORazepam (ATIVAN) 0.5 MG tablet Take 1 tablet by mouth as needed.     mesalamine (CANASA) 1000 MG suppository Place 1,000 mg rectally at bedtime.     nystatin-triamcinolone [facility]  II) cream Apply topically.     triamcinolone (NASACORT) 55 MCG/ACT AERO nasal inhaler Place 2 sprays into the nose daily.     valACYclovir (VALTREX) 500 MG tablet Take 1 tablet (500 mg total) by mouth daily. 30 tablet 12   No current facility-administered medications for this visit.    Family History  Problem Relation Age of Onset   Hypertension Maternal Grandmother    Thrombophlebitis Maternal Grandmother        has had "blood clots"   Thyroid disease Maternal Grandmother    Kidney disease Maternal Grandmother    Macular degeneration Maternal Grandmother    Heart disease Father    Arthritis Father    Lymphoma Father    Colitis Brother    Diabetes Cousin     ROS: Constitutional: negative Genitourinary:heavier flow the last two months  Exam:   BP 118/72 (BP Location: Right Arm, Patient Position:  Sitting, Cuff Size: Normal)   Pulse 77   Ht 5\' 5"  (1.651 m)   Wt 145 lb (65.8 kg)   LMP 02/05/2023 (Approximate)   BMI 24.13 kg/m   Height: 5\' 5"  (165.1 cm)  General appearance: alert, cooperative and appears stated age Head: Normocephalic, without obvious abnormality, atraumatic Neck: no adenopathy, supple, symmetrical, trachea midline and thyroid normal to inspection and palpation Lungs: clear to auscultation bilaterally Breasts: normal appearance, no masses or tenderness Heart: regular rate and rhythm Abdomen: soft, non-tender; bowel sounds normal; no masses,  no organomegaly Extremities: extremities normal, atraumatic, no cyanosis or edema Skin: Skin color, texture, turgor normal. No rashes or lesions Lymph nodes: Cervical, supraclavicular, and axillary nodes normal. No abnormal inguinal nodes palpated Neurologic: Grossly normal   Pelvic: External genitalia:  no lesions              Urethra:  normal appearing urethra with no masses, tenderness or lesions              Bartholins and Skenes: normal                 Vagina: normal appearing vagina with normal color and no discharge, no lesions              Cervix: no lesions              Pap taken: No. Bimanual Exam:  Uterus:  normal size, contour, position, consistency, mobility, non-tender              Adnexa: normal adnexa and no mass, fullness, tenderness               Rectovaginal: deferred               Anus:  normal sphincter tone, no lesions  Chaperone, Ina Homes, CMA, was present for exam.  Assessment/Plan: 1. Well woman exam with routine gynecological exam - Pap smear neg 2023 with neg HR HPV - Mammogram order placed - Colonoscopy done 03/2022 with Dr. Loreta Ave - lab work done with PCP - vaccines reviewed/updated  2. History of tachycardia - Ambulatory referral to Cardiology  3. Encounter for screening mammogram for malignant neoplasm of breast - MM 3D SCREENING MAMMOGRAM BILATERAL BREAST; Future  4. HSV  infection - valACYclovir (VALTREX) 500 MG tablet; Take 1 tablet (500 mg total) by mouth daily.  Dispense: 30 tablet; Refill: 12  5. Ulcerative colitis without complications, unspecified location (HCC)  6. Psoriatic arthritis (HCC)

## 2023-03-23 ENCOUNTER — Ambulatory Visit (HOSPITAL_BASED_OUTPATIENT_CLINIC_OR_DEPARTMENT_OTHER)
Admission: RE | Admit: 2023-03-23 | Discharge: 2023-03-23 | Disposition: A | Discharge status: Home / Self Care | Payer: BC Managed Care – PPO | Source: Ambulatory Visit | Attending: Obstetrics & Gynecology | Admitting: Obstetrics & Gynecology

## 2023-03-23 ENCOUNTER — Encounter (HOSPITAL_BASED_OUTPATIENT_CLINIC_OR_DEPARTMENT_OTHER): Payer: Self-pay | Admitting: Radiology

## 2023-03-23 DIAGNOSIS — Z1231 Encounter for screening mammogram for malignant neoplasm of breast: Secondary | ICD-10-CM | POA: Diagnosis present

## 2023-04-13 NOTE — Progress Notes (Signed)
 [facility] Medicine [address] [place], [place] [address] Ph: [phone] Fax: [phone]    FOLLOW UP   Chief Complaint  Patient presents with  . Six month follow up    Patient here in office today for medication follow up  Assessment / Plan:   Nicie was seen today for six month follow up.  Diagnoses and all orders for this visit:  Nasal congestion -     azelastine (ASTELIN) 0.1 % nasal spray; one spray by Both Nostrils route 2 (two) times daily. Use in each nostril as directed  Palpitations -     Ambulatory referral to Cardiology -     Vitamin B12; Future -     TSH+Free T4; Future -     T3; Future -     Thyroid Peroxidase Antibody; Future -     BNP (B-Type Natriuretic Peptide); Future -     BNP (B-Type Natriuretic Peptide) -     Thyroid Peroxidase Antibody -     T3 -     TSH+Free T4 -     Vitamin B12  Vitamin D deficiency -     Vitamin D 25 Hydroxy; Future -     Vitamin D 25 Hydroxy  Lower extremity edema -     BNP (B-Type Natriuretic Peptide); Future -     BNP (B-Type Natriuretic Peptide)  Continue follow up with cardiology, not able to be sooner Discussed stopping cetirizine-D, rule out cause Will check labs today     Patient's Medications       * Accurate as of April 13, 2023 11:59 PM. Reflects encounter med changes as of last refresh          New Prescriptions      Instructions  azelastine 0.1 % nasal spray Commonly known as: ASTELIN Started by: Kyra McClanahan, NP  1 spray, Both Nostrils, 2 times a day, Use in each nostril as directed       Continued Medications      Instructions  B COMPLEX-B12 PO  Oral   buPROPion HCl 150 mg 24 hr tablet Commonly known as: WELLBUTRIN XL  150 mg, Oral, Every morning   CLARITIN -D 24 HOUR 10-240 MG per 24 hr tablet Generic drug: loratadine -pseudoephedrine  1 tablet, Oral, Daily   dicyclomine 10 mg capsule Commonly known as: BENTYL  10 mg, Oral, 4 times a day    fluconazole 150 mg tablet Commonly known as: DIFLUCAN  Take 1 tablet once; may repeat in 3 days if needed.   FLUoxetine 10 mg capsule Commonly known as: PROZAC  Oral, Daily   LORAzepam 0.5 mg tablet Commonly known as: ATIVAN  TAKE 1 TABLET EVERY 8 HOURSAS NEEDED FOR ANXIETY   mesalamine  1000 MG suppository Commonly known as: CANASA   1,000 mg, Rectal, At bedtime   MULTIPLE VITAMINS/WOMENS PO  Oral   triamcinolone 55 MCG/ACT nasal inhaler Commonly known as: NASACORT,NASAL ALLERGY 24H  2 sprays, Both Nostrils, Daily   valACYclovir  500 mg tablet Commonly known as: VALTREX   500 mg, Oral, As needed   vitamin D3 125 mcg (5000 UT) Caps capsule  No dose, route, or frequency recorded.   Zinc 50 MG Caps  1 capsule, Oral        Orders Placed This Encounter  Procedures  . Vitamin B12  . Vitamin D 25 Hydroxy  . TSH+Free T4  . T3  . Thyroid Peroxidase Antibody  . BNP (B-Type Natriuretic Peptide)  . Ambulatory referral to Cardiology  Reassurance, risks, benefits, and alternatives of the medications and treatment plan prescribed today were discussed, and patient expressed understanding. Plan follow-up as discussed or as needed if any worsening symptoms or change in condition.     Face to face interview, assessment, counseling and chart review preformed this visit.         Subjective:   Patient ID:  Ariel Pierce is a 42 y.o. (DOB [birth year]) female.     Patient presents with  . Six month follow up      HPI:  Patient in office today for follow up on ER visit.  Reports that in August 2024 went to ER for elevated heart 150's and chest pain.  Cardiac work up at that time, clear. Has appointment with Cardiology Jan for further evaluation.  Has had two more incidences since then.  Can last for prolonged time.  Has had some incidences short in duration.  Resting heart rate is 84  ranging 88-119 at home.  Has been taking cetirizine-D, for long time prior  to incidences or increased heart rate.  Discussed stopping this to rule out cause of symptoms.  Does not consume excessive caffeine or energy drinks.  No new medication or supplements.   Review of Systems  Constitutional:  Positive for activity change. Negative for appetite change, chills, diaphoresis, fatigue, fever and unexpected weight change.  Eyes: Negative.   Respiratory: Negative.    Cardiovascular: Negative.   Gastrointestinal: Negative.     Negative except noted above.   Past Medical History, Past Surgery History, Allergies, Social History, and Family History were reviewed and updated.      Objective:    BP 110/80 (BP Location: Left Upper Arm, Patient Position: Sitting)   Pulse 91   Temp 98.4 F (36.9 C) (Oral)   Resp 16   Ht 5' 5 (1.651 m)   Wt 150 lb (68 kg)   SpO2 96%   BMI 24.96 kg/m   Physical Exam Vitals reviewed.  Constitutional:      Appearance: Normal appearance. She is normal weight.  HENT:     Head: Normocephalic.  Cardiovascular:     Rate and Rhythm: Tachycardia present.     Pulses: Normal pulses.     Heart sounds: Normal heart sounds. No murmur heard.    No friction rub. No gallop.  Musculoskeletal:     Cervical back: Normal range of motion. No rigidity.  Pulmonary:     Effort: Pulmonary effort is normal.     Breath sounds: Normal breath sounds.  Abdominal:     Tenderness: There is no right CVA tenderness or left CVA tenderness.  Skin:    General: Skin is warm.     Capillary Refill: Capillary refill takes less than 2 seconds.  Neurological:     General: No focal deficit present.     Mental Status: She is alert.  Psychiatric:        Mood and Affect: Mood normal.        Behavior: Behavior normal.        Thought Content: Thought content normal.        Judgment: Judgment normal.          Efrain Broaden, NP 04/13/2023, 1:20 PM  *Some images could not be shown.

## 2023-05-03 ENCOUNTER — Ambulatory Visit (HOSPITAL_BASED_OUTPATIENT_CLINIC_OR_DEPARTMENT_OTHER): Payer: BC Managed Care – PPO | Admitting: Cardiology

## 2023-07-17 ENCOUNTER — Ambulatory Visit: Payer: BC Managed Care – PPO | Admitting: Internal Medicine

## 2023-07-25 ENCOUNTER — Encounter (HOSPITAL_BASED_OUTPATIENT_CLINIC_OR_DEPARTMENT_OTHER): Payer: Self-pay

## 2023-12-17 ENCOUNTER — Encounter: Payer: Self-pay | Admitting: Cardiology

## 2023-12-17 ENCOUNTER — Ambulatory Visit: Attending: Cardiology | Admitting: Cardiology

## 2023-12-17 VITALS — BP 108/66 | HR 89 | Ht 65.0 in | Wt 152.4 lb

## 2023-12-17 DIAGNOSIS — R002 Palpitations: Secondary | ICD-10-CM | POA: Diagnosis not present

## 2023-12-17 DIAGNOSIS — Z01810 Encounter for preprocedural cardiovascular examination: Secondary | ICD-10-CM | POA: Diagnosis not present

## 2023-12-17 DIAGNOSIS — Z8249 Family history of ischemic heart disease and other diseases of the circulatory system: Secondary | ICD-10-CM | POA: Diagnosis not present

## 2023-12-17 NOTE — Progress Notes (Signed)
 Cardiology Office Note:  .   Date:  12/17/2023  ID:  Ariel Pierce, DOB 30-Nov-1980, MRN [id number] PCP: Sophronia Ozell BROCKS, MD  [facility] Providers Cardiologist:  Newman Lawrence, MD PCP: Sophronia Ozell BROCKS, MD  Chief Complaint  Patient presents with   Palpitations   Tachycardia     Ariel Pierce is a 43 y.o. female with family history of early CAD, tachycardia  Discussed the use of AI scribe software for clinical note transcription with the patient, who gave verbal consent to proceed.  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 She is physically active with weight training, but not much in terms of aerobic activity.  In October of last year, she experienced an episode of elevated heart rate throughout the day, with chest pain described as 'heavy.' She had dyspnea on exertion and a heart rate of 145 bpm while supine. She visited the ER, where her heart rate eventually decreased, but no formal diagnosis was made. She has not followed up with cardiology due to scheduling issues.  Since the initial episode, she has experienced sporadic episodes of elevated heart rate, sometimes reaching 120 bpm while sitting, lasting off and on a couple of hours. These episodes are often asymptomatic and sometimes only noticed when checking her heart rate on her watch.  Her heart rate goes up to 100 bpm or so when walking.  She currently denies chest pain, shortness of breath, lightheadedness, or syncope. She notes some ankle swelling, attributed to prolonged sitting.  Her family history is significant for heart disease on her father's side, with her father having had a myocardial infarction around her current age. A CT scan showed a calcium score of zero. Her last lipid panel in December of last year showed a total cholesterol of 203 mg/dL, HDL of 74 mg/dL, and LDL of 887 mg/dL.  She maintains a high-protein diet, consuming red meat twice a week,  and is active, engaging in weightlifting three to five times a week. She is starting to incorporate more cardio into her routine. She uses a pre-workout supplement containing caffeine, and her resting heart rate is typically in the 80s.    Vitals:   12/17/23 1015  BP: 108/66  Pulse: 89  SpO2: 98%      Review of Systems  Cardiovascular:  Negative for chest pain, dyspnea on exertion, leg swelling, palpitations and syncope.        Studies Reviewed: SABRA        EKG 12/17/2023: Normal sinus rhythm Normal ECG When compared with ECG of 24-Jan-2023 23:49,  rate is slower    CT cardiac scoring 10/2022: Calcium score 0  CTA chest 01/2023: No evidence of pulmonary embolus. No acute cardiopulmonary disease.  Labs 10-03/2023: Chol 203, TG 96, HDL 74, LDL 112 Hb 14.3 Cr 0.82 Trop HS 2 BNP 9.2, TSH 1.0   Physical Exam Vitals and nursing note reviewed.  Constitutional:      General: She is not in acute distress. Neck:     Vascular: No JVD.  Cardiovascular:     Rate and Rhythm: Normal rate and regular rhythm.     Heart sounds: Normal heart sounds. No murmur heard. Pulmonary:     Effort: Pulmonary effort is normal.     Breath sounds: Normal breath sounds. No wheezing or rales.  Musculoskeletal:     Right lower leg: No edema.     Left lower leg: No edema.  VISIT DIAGNOSES:   ICD-10-CM   1. Palpitations  R00.2 EKG 12-Lead    2. Family history of early CAD  Z70.49 CT CARDIAC SCORING (SELF PAY ONLY)    3. Preop cardiovascular exam  Z01.810        Ariel Pierce is a 43 y.o. female with family history of early CAD, tachycardia Assessment & Plan Tachycardia: Intermittent episodes of rapid heart rate, not highly symptomatic, with normal heart exam and EKG. No immediate need for further cardiac workup unless symptoms worsen or become more frequent. - Monitor heart rate using a smartwatch. - Increase aerobic activity. - Reduce caffeine intake. - Consider  cardiac monitor if heart rate exceeds 120 bpm at rest for several minutes or hours.  Mixed hyperlipidemia: Strong family history of early CAD. Calcium score 0 in 2024. Discussed heartedly diet and lifestyle.  Particularly, consider reducing red meat intake, and increasing aerobic activity. Continue annual lipid panel checked with PCP. Repeat calcium score in 2028.  Pre-op risk stratification: Low cardiac risk for upcoming endoscopy/colonoscopy.   Normal physical exam and EKG.   No further cardiac workup necessary at this time. Okay to proceed with endoscopy/colonoscopy.    F/u in 2028  Signed, Newman JINNY Lawrence, MD

## 2023-12-17 NOTE — Patient Instructions (Signed)
 Testing/Procedures: CALCIUM SCORE SCAN IN 04/2026  CT scanning for a cardiac calcium score (CAT scanning), is a noninvasive, special x-ray that produces cross-sectional images of the body using x-rays and a computer. CT scans help physicians diagnose and treat medical conditions. For some CT exams, a contrast material is used to enhance visibility in the area of the body being studied. CT scans provide greater clarity and reveal more details than regular x-ray exams.   Your physician has requested that you have a coronary calcium score performed. This is not covered by insurance and will be an out-of-pocket cost of approximately $99.   Follow-Up: At [facility], you and your health needs are our priority.  As part of our continuing mission to provide you with exceptional heart care, our providers are all part of one team.  This team includes your primary Cardiologist (physician) and Advanced Practice Providers or APPs (Physician Assistants and Nurse Practitioners) who all work together to provide you with the care you need, when you need it.  Your next appointment:   04/2026 AFTER CALCIUM SCORE   Provider:   Newman JINNY Lawrence, MD

## 2024-02-15 ENCOUNTER — Ambulatory Visit (INDEPENDENT_AMBULATORY_CARE_PROVIDER_SITE_OTHER): Payer: BC Managed Care – PPO | Admitting: Obstetrics & Gynecology

## 2024-02-15 ENCOUNTER — Encounter (HOSPITAL_BASED_OUTPATIENT_CLINIC_OR_DEPARTMENT_OTHER): Payer: Self-pay | Admitting: Obstetrics & Gynecology

## 2024-02-15 VITALS — BP 111/68 | HR 90 | Ht 65.0 in | Wt 150.2 lb

## 2024-02-15 DIAGNOSIS — Z98891 History of uterine scar from previous surgery: Secondary | ICD-10-CM

## 2024-02-15 DIAGNOSIS — R61 Generalized hyperhidrosis: Secondary | ICD-10-CM

## 2024-02-15 DIAGNOSIS — L4050 Arthropathic psoriasis, unspecified: Secondary | ICD-10-CM

## 2024-02-15 DIAGNOSIS — Z01419 Encounter for gynecological examination (general) (routine) without abnormal findings: Secondary | ICD-10-CM

## 2024-02-15 DIAGNOSIS — B009 Herpesviral infection, unspecified: Secondary | ICD-10-CM

## 2024-02-15 DIAGNOSIS — K5190 Ulcerative colitis, unspecified, without complications: Secondary | ICD-10-CM

## 2024-02-15 MED ORDER — VALACYCLOVIR HCL 500 MG PO TABS: 500.0000 mg | ORAL_TABLET | Freq: Every day | ORAL | 12 refills | Status: AC

## 2024-02-15 NOTE — Progress Notes (Signed)
 ANNUAL EXAM Patient name: Ariel Pierce MRN [id number]  Date of birth: Oct 30, 1980 Chief Complaint:   Annual Exam  History of Present Illness:   Ariel Pierce is a 43 y.o. G59P2002 Caucasian female being seen today for a routine annual exam.   Doing well.  Cycles are regular.  Had colonoscopy this year.  Having a lot of hot flashes.  Struggling with sleep.  Does get up three or four times a night.  Having some hot flashes as well.  Sometimes has to change shirt due to being so sweaty.    Patient's last menstrual period was 02/08/2024 (exact date).   Upstream - 02/15/24 0824       Pregnancy Intention Screening   Does the patient want to become pregnant in the next year? No    Does the patient's partner want to become pregnant in the next year? No    Would the patient like to discuss contraceptive options today? No      Contraception Wrap Up   Current Method Vasectomy          Last pap 12/23/2021. Results were: NILM w/ HRHPV negative. H/O abnormal pap: yes Last mammogram: 03/23/2023. Results were: normal. Family h/o breast cancer: no Last colonoscopy: 12/19/2023. Results were: normal. Family h/o colorectal cancer: no     02/15/2024    8:23 AM 02/13/2023    8:44 AM 12/23/2021    8:58 AM 12/16/2020    3:58 PM 05/11/2015    8:38 AM  Depression screen PHQ 2/9  Decreased Interest 0 0 0 0 0  Down, Depressed, Hopeless 0 0 0 0 0  PHQ - 2 Score 0 0 0 0 0        02/15/2024    8:24 AM  GAD 7 : Generalized Anxiety Score  Nervous, Anxious, on Edge 0  Control/stop worrying 0  Worry too much - different things 0  Trouble relaxing 0  Restless 0  Easily annoyed or irritable 0  Afraid - awful might happen 0  Total GAD 7 Score 0  Anxiety Difficulty Not difficult at all     Review of Systems:   Pertinent items are noted in HPI Denies any urinary issues.  Denies pelvic pain.   Pertinent History Reviewed:  Reviewed past medical,surgical, social and family history.   Reviewed problem list, medications and allergies. Physical Assessment:   Vitals:   02/15/24 0816  BP: 111/68  Pulse: 90  SpO2: 97%  Weight: 150 lb 3.2 oz (68.1 kg)  Height: 5' 5 (1.651 m)  Body mass index is 24.99 kg/m.        Physical Examination:   General appearance - well appearing, and in no distress  Mental status - alert, oriented to person, place, and time  Psych:  She has a normal mood and affect  Skin - warm and dry, normal color, no suspicious lesions noted  Chest - effort normal, all lung fields clear to auscultation bilaterally  Heart - normal rate and regular rhythm  Neck:  midline trachea, no thyromegaly or nodules  Breasts - breasts appear normal, no suspicious masses, no skin or nipple changes or  axillary nodes  Abdomen - soft, nontender, nondistended, no masses or organomegaly  Pelvic - VULVA: normal appearing vulva with no masses, tenderness or lesions  VAGINA: normal appearing vagina with normal color and discharge, no lesions   CERVIX: normal appearing cervix without discharge or lesions, no CMT  Thin prep pap is not indicated today  UTERUS: uterus is felt to be normal size, shape, consistency and nontender   ADNEXA: No adnexal masses or tenderness noted.  Rectal - deferred  Extremities:  No swelling or varicosities noted  Chaperone present for exam  No results found for this or any previous visit (from the past 24 hours).  Assessment & Plan:  1. Well woman exam with routine gynecological exam (Primary) - Pap smear 2023 - Mammogram 02/2023 - Colonoscopy done earlier this year - lab work ordered as per belowe - vaccines reviewed/updated  2. HSV infection - valACYclovir  (VALTREX ) 500 MG tablet; Take 1 tablet (500 mg total) by mouth daily.  Dispense: 30 tablet; Refill: 12  3. Night sweats - TSH - CBC with Differential/Platelet - Comprehensive metabolic panel with GFR - HIV Antibody (routine testing w rflx) - C-reactive protein - Anti mullerian  hormone  4. Ulcerative colitis without complications, unspecified location [facility] - followed by Dr. Kristie  5. Psoriatic arthritis (HCC)  6. S/P cesarean section    Orders Placed This Encounter  Procedures   TSH   CBC with Differential/Platelet   Comprehensive metabolic panel with GFR   HIV Antibody (routine testing w rflx)   C-reactive protein   Anti mullerian hormone    Meds:  Meds ordered this encounter  Medications   valACYclovir  (VALTREX ) 500 MG tablet    Sig: Take 1 tablet (500 mg total) by mouth daily.    Dispense:  30 tablet    Refill:  12    Please file rx and pt will call when she needs RF.  Thanks.    Ronal GORMAN Pinal, MD 02/15/2024 8:51 AM

## 2024-02-22 LAB — CBC WITH DIFFERENTIAL/PLATELET
Basophils Absolute: 0 x10E3/uL (ref 0.0–0.2)
Basos: 1 %
EOS (ABSOLUTE): 0.2 x10E3/uL (ref 0.0–0.4)
Eos: 3 %
Hematocrit: 45.4 % (ref 34.0–46.6)
Hemoglobin: 14.8 g/dL (ref 11.1–15.9)
Immature Grans (Abs): 0 x10E3/uL (ref 0.0–0.1)
Immature Granulocytes: 0 %
Lymphocytes Absolute: 1.4 x10E3/uL (ref 0.7–3.1)
Lymphs: 22 %
MCH: 31.3 pg (ref 26.6–33.0)
MCHC: 32.6 g/dL (ref 31.5–35.7)
MCV: 96 fL (ref 79–97)
Monocytes Absolute: 0.6 x10E3/uL (ref 0.1–0.9)
Monocytes: 9 %
Neutrophils Absolute: 4.3 x10E3/uL (ref 1.4–7.0)
Neutrophils: 65 %
Platelets: 313 x10E3/uL (ref 150–450)
RBC: 4.73 x10E6/uL (ref 3.77–5.28)
RDW: 12.3 % (ref 11.7–15.4)
WBC: 6.5 x10E3/uL (ref 3.4–10.8)

## 2024-02-22 LAB — COMPREHENSIVE METABOLIC PANEL WITH GFR
ALT: 21 IU/L (ref 0–32)
AST: 31 IU/L (ref 0–40)
Albumin: 4 g/dL (ref 3.9–4.9)
Alkaline Phosphatase: 95 IU/L (ref 41–116)
BUN/Creatinine Ratio: 26 — ABNORMAL HIGH (ref 9–23)
BUN: 27 mg/dL — ABNORMAL HIGH (ref 6–24)
Bilirubin Total: 0.2 mg/dL (ref 0.0–1.2)
CO2: 24 mmol/L (ref 20–29)
Calcium: 8.8 mg/dL (ref 8.7–10.2)
Chloride: 100 mmol/L (ref 96–106)
Creatinine, Ser: 1.03 mg/dL — ABNORMAL HIGH (ref 0.57–1.00)
Globulin, Total: 2.3 g/dL (ref 1.5–4.5)
Glucose: 73 mg/dL (ref 70–99)
Potassium: 4.5 mmol/L (ref 3.5–5.2)
Sodium: 138 mmol/L (ref 134–144)
Total Protein: 6.3 g/dL (ref 6.0–8.5)
eGFR: 69 mL/min/1.73 (ref >59)

## 2024-02-22 LAB — TSH: TSH: 0.884 u[IU]/mL (ref 0.450–4.500)

## 2024-02-22 LAB — ANTI MULLERIAN HORMONE: ANTI-MULLERIAN HORMONE (AMH): 1.02 ng/mL

## 2024-02-22 LAB — C-REACTIVE PROTEIN: CRP: 1 mg/L (ref 0–10)

## 2024-02-22 LAB — HIV ANTIBODY (ROUTINE TESTING W REFLEX): HIV Screen 4th Generation wRfx: NONREACTIVE

## 2024-02-25 ENCOUNTER — Ambulatory Visit: Admitting: Cardiology

## 2024-03-02 ENCOUNTER — Ambulatory Visit (HOSPITAL_BASED_OUTPATIENT_CLINIC_OR_DEPARTMENT_OTHER): Payer: Self-pay | Admitting: Obstetrics & Gynecology

## 2024-03-02 DIAGNOSIS — R7989 Other specified abnormal findings of blood chemistry: Secondary | ICD-10-CM
